# Patient Record
Sex: Female | Born: 1960 | Race: White | Hispanic: No | Marital: Married | Smoking: Never smoker
Health system: Southern US, Community
[De-identification: ages and names within clinical notes are randomized; demographics above are authoritative.]

## PROBLEM LIST (undated history)

## (undated) DIAGNOSIS — T7840XA Allergy, unspecified, initial encounter: Secondary | ICD-10-CM

## (undated) DIAGNOSIS — H269 Unspecified cataract: Secondary | ICD-10-CM

## (undated) DIAGNOSIS — N946 Dysmenorrhea, unspecified: Secondary | ICD-10-CM

## (undated) DIAGNOSIS — I10 Essential (primary) hypertension: Secondary | ICD-10-CM

## (undated) DIAGNOSIS — F419 Anxiety disorder, unspecified: Secondary | ICD-10-CM

## (undated) DIAGNOSIS — E559 Vitamin D deficiency, unspecified: Secondary | ICD-10-CM

## (undated) DIAGNOSIS — G4730 Sleep apnea, unspecified: Secondary | ICD-10-CM

## (undated) DIAGNOSIS — E789 Disorder of lipoprotein metabolism, unspecified: Secondary | ICD-10-CM

## (undated) DIAGNOSIS — E538 Deficiency of other specified B group vitamins: Secondary | ICD-10-CM

## (undated) DIAGNOSIS — F32A Depression, unspecified: Secondary | ICD-10-CM

## (undated) DIAGNOSIS — G43909 Migraine, unspecified, not intractable, without status migrainosus: Secondary | ICD-10-CM

## (undated) DIAGNOSIS — F329 Major depressive disorder, single episode, unspecified: Secondary | ICD-10-CM

## (undated) DIAGNOSIS — K5900 Constipation, unspecified: Secondary | ICD-10-CM

## (undated) DIAGNOSIS — C50919 Malignant neoplasm of unspecified site of unspecified female breast: Secondary | ICD-10-CM

## (undated) DIAGNOSIS — R739 Hyperglycemia, unspecified: Secondary | ICD-10-CM

## (undated) DIAGNOSIS — M549 Dorsalgia, unspecified: Secondary | ICD-10-CM

## (undated) DIAGNOSIS — N912 Amenorrhea, unspecified: Secondary | ICD-10-CM

## (undated) DIAGNOSIS — E781 Pure hyperglyceridemia: Secondary | ICD-10-CM

## (undated) DIAGNOSIS — M4200 Juvenile osteochondrosis of spine, site unspecified: Secondary | ICD-10-CM

## (undated) DIAGNOSIS — R7303 Prediabetes: Secondary | ICD-10-CM

## (undated) DIAGNOSIS — K760 Fatty (change of) liver, not elsewhere classified: Secondary | ICD-10-CM

## (undated) HISTORY — DX: Juvenile osteochondrosis of spine, site unspecified: M42.00

## (undated) HISTORY — PX: BREAST SURGERY: SHX581

## (undated) HISTORY — PX: GUM SURGERY: SHX658

## (undated) HISTORY — DX: Dysmenorrhea, unspecified: N94.6

## (undated) HISTORY — DX: Constipation, unspecified: K59.00

## (undated) HISTORY — DX: Amenorrhea, unspecified: N91.2

## (undated) HISTORY — DX: Allergy, unspecified, initial encounter: T78.40XA

## (undated) HISTORY — DX: Depression, unspecified: F32.A

## (undated) HISTORY — DX: Vitamin D deficiency, unspecified: E55.9

## (undated) HISTORY — DX: Disorder of lipoprotein metabolism, unspecified: E78.9

## (undated) HISTORY — DX: Unspecified cataract: H26.9

## (undated) HISTORY — DX: Hyperglycemia, unspecified: R73.9

## (undated) HISTORY — DX: Deficiency of other specified B group vitamins: E53.8

## (undated) HISTORY — DX: Prediabetes: R73.03

## (undated) HISTORY — DX: Malignant neoplasm of unspecified site of unspecified female breast: C50.919

## (undated) HISTORY — DX: Major depressive disorder, single episode, unspecified: F32.9

## (undated) HISTORY — DX: Migraine, unspecified, not intractable, without status migrainosus: G43.909

## (undated) HISTORY — DX: Anxiety disorder, unspecified: F41.9

## (undated) HISTORY — PX: BUNIONECTOMY: SHX129

## (undated) HISTORY — DX: Dorsalgia, unspecified: M54.9

## (undated) HISTORY — PX: WISDOM TOOTH EXTRACTION: SHX21

## (undated) HISTORY — DX: Pure hyperglyceridemia: E78.1

## (undated) HISTORY — DX: Essential (primary) hypertension: I10

## (undated) HISTORY — DX: Sleep apnea, unspecified: G47.30

## (undated) HISTORY — DX: Fatty (change of) liver, not elsewhere classified: K76.0

---

## 2014-05-10 DIAGNOSIS — H5213 Myopia, bilateral: Secondary | ICD-10-CM | POA: Insufficient documentation

## 2014-05-10 DIAGNOSIS — M3501 Sicca syndrome with keratoconjunctivitis: Secondary | ICD-10-CM | POA: Insufficient documentation

## 2015-09-10 DIAGNOSIS — C50919 Malignant neoplasm of unspecified site of unspecified female breast: Secondary | ICD-10-CM

## 2015-09-10 HISTORY — PX: BREAST LUMPECTOMY: SHX2

## 2015-09-10 HISTORY — DX: Malignant neoplasm of unspecified site of unspecified female breast: C50.919

## 2016-01-30 DIAGNOSIS — C50511 Malignant neoplasm of lower-outer quadrant of right female breast: Secondary | ICD-10-CM | POA: Insufficient documentation

## 2017-02-18 DIAGNOSIS — F3342 Major depressive disorder, recurrent, in full remission: Secondary | ICD-10-CM | POA: Insufficient documentation

## 2018-01-21 ENCOUNTER — Other Ambulatory Visit: Payer: Self-pay

## 2018-01-21 ENCOUNTER — Ambulatory Visit: Payer: Managed Care, Other (non HMO) | Attending: Physical Medicine & Rehabilitation | Admitting: Occupational Therapy

## 2018-01-21 DIAGNOSIS — L905 Scar conditions and fibrosis of skin: Secondary | ICD-10-CM | POA: Diagnosis present

## 2018-01-21 DIAGNOSIS — I890 Lymphedema, not elsewhere classified: Secondary | ICD-10-CM

## 2018-01-21 DIAGNOSIS — R208 Other disturbances of skin sensation: Secondary | ICD-10-CM | POA: Insufficient documentation

## 2018-01-21 NOTE — Therapy (Signed)
[place] [facility] [address] [place], [address] Phone: [phone]   Fax:  [phone]  Occupational Therapy Evaluation  Patient Details  Name: Alexandra Fitzpatrick MRN: [id number] Date of Birth: 1960/10/24 Referring Provider: Jola Baptist   Encounter Date: 01/21/2018  OT End of Session - 01/21/18 1329    Visit Number  1    Number of Visits  6    Date for OT Re-Evaluation  03/04/18    OT Start Time  1030    OT Stop Time  1138    OT Time Calculation (min)  68 min    Activity Tolerance  Patient tolerated treatment well    Behavior During Therapy  [facility] for tasks assessed/performed       No past medical history on file.  There were no vitals filed for this visit.  Subjective Assessment - 01/21/18 1228    Subjective   I had breast CA in May 2017 , had lumpectomy and radiation ended Oct 17 - no chemo -and then lymphedema started about Oct/Nov 17 - we moved 2 x since Dec 17 and has heaviness, tightness , stiffness , fullness in my R breast , under arm , upper back and arm -     Pertinent History  May 17 R breast CA, 2 ln removed and one was positive , no chemo but radiation - and that ended Oct 17  - R breast gets hard and tender and some pain at times ,     Patient Stated Goals  I do want to keep my lymphedema under control - the hardness, pain so I can use my arm without issues to swim, play with dogs, do things around the house and in yard     Currently in Pain?  No/denies        [facility] OT Assessment - 01/21/18 0001      Assessment   Medical Diagnosis  R upper Quadrant lymphedema     Referring Provider  Jola Baptist    Onset Date/Surgical Date  01/08/16    Hand Dominance  Right    Next MD Visit  -- she goes quaterly to [facility] for checkups       Precautions   Precaution Comments  -- lymphedema       Home  Environment   Lives With  Spouse      Prior Function   Vocation  Full time employment     Leisure  Work from home on computer, likes to swim , walk , yard , dog        LYMPHEDEMA/ONCOLOGY QUESTIONNAIRE - 01/21/18 1052      Right Upper Extremity Lymphedema   15 cm Proximal to Olecranon Process  39.8 cm    10 cm Proximal to Olecranon Process  35.5 cm    Olecranon Process  30.3 cm    15 cm Proximal to Ulnar Styloid Process  28 cm    10 cm Proximal to Ulnar Styloid Process  23.5 cm    Just Proximal to Ulnar Styloid Process  18 cm    Across Hand at [employer]  21.3 cm    At [place] of 2nd Digit  6.6 cm    At [facility] of Thumb  6.5 cm      Left Upper Extremity Lymphedema   15 cm Proximal to Olecranon Process  39.5 cm    10 cm Proximal to Olecranon Process  38.5 cm  Olecranon Process  30 cm    15 cm Proximal to Ulnar Styloid Process  27.5 cm    10 cm Proximal to Ulnar Styloid Process  23 cm    Just Proximal to Ulnar Styloid Process  18.5 cm    Across Hand at [employer]  21.4 cm    At [place] of 2nd Digit  7 cm    At [facility] of Thumb  6.8 cm       Discuss findings of eval   Pt to look at sleeping position and work computer setup - for avoid  propping elbow, sleeping with elbow and wrist flexed  And ergonomic setup for computer  And then over the counter compression sleeve and glove for hight risk act  And jovipak unilateral mastectomy breast pad wear as needed   pt to get prescription and refer to [facility]   And then taught self MLD  And done in clinic Do manual lymph drainage once each day to help decrease swelling.  This should take you about 30 minutes depending on the size of your limb.  For Right Arm:  Felicie Morn yourself at the base of your neck and do 8 small circles, and 2 fingers behind clavicle 8 x  . Do 8 semicircles at left armpit and right groin . Pump across chest from right to left 8 times . Pump down the right side of trunk from armpit to groin 8 times . Pump up the outside of right upper arm 8 times, inside of upper arm to outside 8x, outside of upper arm  again 8x  . Pump across chest from R to L 8 times . Pump  down the right side of trunk from armpit to groin 8 times . Do 8 semicircles at left armpit and right groin 8 times . Repeat nr.1  SLOW and LIGHT with only your palm NOT FINGERTIPS If help at home can replace or do also massage over upper back from right to left , when doing chest from right to left.                OT Education - 01/21/18 1329    Education provided  Yes    Education Details  findings , HEP , MLD -     Person(s) Educated  Patient    Methods  Explanation;Demonstration;Tactile cues;Verbal cues;Handout    Comprehension  Returned demonstration;Verbalized understanding          OT Long Term Goals - 01/21/18 1444      OT LONG TERM GOAL #1   Title  Pt to be independent in HEP to decrease fibrosis in R breast , perfrom self MLD/ and compression garments  to decrease and maintain circumference in R UE and upper quadrant     Baseline  no knowledge of HEP or garments     Time  4    Period  Weeks    Status  New    Target Date  02/18/18      OT LONG TERM GOAL #2   Title  Pt report decrease sensory symptoms in R hand at daytime and night time     Baseline  R 3rd and 4th numbness at night time and sometimes during day -positive Phalens     Time  6    Period  Weeks    Status  New    Target Date  03/04/18            Plan - 01/21/18 1436  Clinical Impression Statement  Pt present with stage 1 lymphedema in R upper quadrant - pt report that she has at times increase heaviness, tightness , fullness under R arm , in breast , upper back and upper arm - hand also is stiff and tight in the am with some  numbness in  3rd and 4th digits - pt  did had positive Froments - and at times tender over CT and cubital tunne per pt - discuss wiht pt her sleeping position, desk and computer setup  - pt do report that R breast at times harder than other times and shooting pains up to 4/10 - pt did had some fibrosis on  superior R breast - with circumference measurements pt  R  UE was no increase this date compare to L - did recommend for pt  a daytime over the  counter compression sleeve and glove for high risk activities , and Unilateral  post mastecomy breast pad to wear under bra as needed - and  taught her  self MLD - pt can benefit  from OT servicises to decrease symptoms and keer her lymphedema in stage 1     Occupational performance deficits (Please refer to evaluation for details):  ADL's;IADL's;Rest and Sleep;Work;Play;Leisure;Social Participation    Rehab Potential  Good    OT Frequency  1x / week    OT Duration  6 weeks    OT Treatment/Interventions  Therapeutic exercise;Self-care/ADL training;Manual lymph drainage;Patient/family education    Plan  Assess sleeve and glove fitting - and remeasure circumference and do MLD     Clinical Decision Making  Limited treatment options, no task modification necessary    OT Home Exercise Plan  see pt instruction    Consulted and Agree with Plan of Care  Patient       Patient will benefit from skilled therapeutic intervention in order to improve the following deficits and impairments:  Increased edema, Pain, Impaired sensation, Decreased scar mobility, Decreased strength, Impaired UE functional use  Visit Diagnosis: Lymphedema, not elsewhere classified - Plan: Ot plan of care cert/re-cert  Scar condition and fibrosis of skin - Plan: Ot plan of care cert/re-cert  Other disturbances of skin sensation - Plan: Ot plan of care cert/re-cert    Problem List There are no active problems to display for this patient.   Rosalyn Gess OTR/L,CLT  01/21/2018, 2:56 PM  [place] [facility] [address] [place], [address] Phone: [phone]   Fax:  [phone]  Name: Alexandra Fitzpatrick MRN: [id number] Date of Birth: [birth year]

## 2018-01-21 NOTE — Patient Instructions (Signed)
Pt to look at sleeping position and work computer setup - for propping elbow, sleeping with elbow and wrist flexed  And ergonomic setup for computer  And then over the counter compression sleeve and glove for hight risk act  And jovipak unilateral mastectomy breast pad wear as needed   pt to get prescription and refer to [facility]   And then taught self MLD  Do manual lymph drainage once each day to help decrease swelling.  This should take you about 30 minutes depending on the size of your limb.  For Right Arm:  Felicie Morn yourself at the base of your neck and do 8 small circles, and 2 fingers behind clavicle 8 x  . Do 8 semicircles at left armpit and right groin . Pump across chest from right to left 8 times . Pump down the right side of trunk from armpit to groin 8 times . Pump up the outside of right upper arm 8 times, inside of upper arm to outside 8x, outside of upper arm again 8x  . Pump across chest from R to L 8 times . Pump  down the right side of trunk from armpit to groin 8 times . Do 8 semicircles at left armpit and right groin 8 times . Repeat nr.1  SLOW and LIGHT with only your palm NOT FINGERTIPS If help at home can replace or do also massage over upper back from right to left , when doing chest from right to left.

## 2018-01-28 ENCOUNTER — Ambulatory Visit: Payer: Managed Care, Other (non HMO) | Admitting: Occupational Therapy

## 2018-01-28 DIAGNOSIS — L905 Scar conditions and fibrosis of skin: Secondary | ICD-10-CM

## 2018-01-28 DIAGNOSIS — I890 Lymphedema, not elsewhere classified: Secondary | ICD-10-CM

## 2018-01-28 DIAGNOSIS — R208 Other disturbances of skin sensation: Secondary | ICD-10-CM

## 2018-01-28 NOTE — Therapy (Signed)
[place] [facility] [address] [place], [address] Phone: [phone]   Fax:  [phone]  Occupational Therapy Treatment  Patient Details  Name: Alexandra Fitzpatrick MRN: [id number] Date of Birth: 1960-10-23 Referring Provider: Jola Baptist   Encounter Date: 01/28/2018  OT End of Session - 01/28/18 1114    Visit Number  2    Number of Visits  6    Date for OT Re-Evaluation  03/04/18    OT Start Time  1020    OT Stop Time  1102    OT Time Calculation (min)  42 min    Activity Tolerance  Patient tolerated treatment well    Behavior During Therapy  [facility] for tasks assessed/performed       No past medical history on file.   There were no vitals filed for this visit.  Subjective Assessment - 01/28/18 1111    Subjective   I got the unilateral mastectomy pad - how  often do you want me to wear it, and then they order my sleeve- will take about 7 days- I did the massage and also change my computer setup and height of chair     Patient Stated Goals  I do want to keep my lymphedema under control - the hardness, pain so I can use my arm without issues to swim, play with dogs, do things around the house and in yard     Currently in Pain?  No/denies          LYMPHEDEMA/ONCOLOGY QUESTIONNAIRE - 01/28/18 1036      Right Upper Extremity Lymphedema   15 cm Proximal to Olecranon Process  39.3 cm    10 cm Proximal to Olecranon Process  36 cm    Olecranon Process  30.5 cm    15 cm Proximal to Ulnar Styloid Process  28 cm    10 cm Proximal to Ulnar Styloid Process  23 cm    Just Proximal to Ulnar Styloid Process  18 cm    Across Hand at [employer]  21.5 cm    At [place] of 2nd Digit  6.6 cm    At [facility] of Thumb  6.6 cm     Measurements still the same -   Pt report less stiffness and tightness in hand and wrist - less numbness in fingers Id had some pain at DIP of 4th - but could be nodule on DIP she had in past  come out on another finger   Pt report she did look at sleeping position and work computer setup -change it and think that is helping - also  for avoid  propping elbow, sleeping with elbow and wrist flexed  She did go to [employer] and ordered over the  counter compression sleeve and glove for hight risk act  And jovipak unilateral mastectomy breast pad she got - reviewed with pt wearing of it   Pt ask to review sellf MLD  And done in clinic Do manual lymph drainage once each day to help decrease swelling.  This should take you about 30 minutes depending on the size of your limb.  For Right Arm:   Hug yourself at the base of your neck and do 8 small circles, and 2 fingers behind clavicle 8 x   Do 8 semicircles at left armpit and right groin  Pump across chest from right to left 8 times  Pump down the right side of trunk from  armpit to groin 8 times  Pump up the outside of right upper arm 8 times, inside of upper arm to outside 8x, outside of upper arm again 8x   Pump across chest from R to L 8 times  Pump  down the right side of trunk from armpit to groin 8 times  Do 8 semicircles at left armpit and right groin 8 times  Repeat nr.1  SLOW and LIGHT with only your palm NOT FINGERTIPS If help at home can replace or do also massage over upper back from right to left , when doing chest from right to left.   Needed some min A for hand placement , sequence and pressure                   OT Education - 01/28/18 1114    Education provided  Yes    Education Details  MLD review and wearing of compression     Person(s) Educated  Patient    Methods  Explanation;Demonstration    Comprehension  Verbalized understanding;Returned demonstration          OT Long Term Goals - 01/21/18 1444      OT LONG TERM GOAL #1   Title  Pt to be independent in HEP to decrease fibrosis in R breast , perfrom self MLD/ and compression garments  to decrease and maintain  circumference in R UE and upper quadrant     Baseline  no knowledge of HEP or garments     Time  4    Period  Weeks    Status  New    Target Date  02/18/18      OT LONG TERM GOAL #2   Title  Pt report decrease sensory symptoms in R hand at daytime and night time     Baseline  R 3rd and 4th numbness at night time and sometimes during day -positive Phalens     Time  6    Period  Weeks    Status  New    Target Date  03/04/18            Plan - 01/28/18 1115    Clinical Impression Statement  Pt report less tightness, stiffness in wrist and hands - as well as chest - needed review of self MLD -and use of jovipak breast pad - she did order her compression sleeve and glove - she is flying in less than 3 wks - she did change her work station to decrease possible compression on cubital tunnel and CT - review [place] of unilateral mastectmomy pad and hold off on fibrotic tech on breast     Occupational performance deficits (Please refer to evaluation for details):  ADL's;IADL's;Rest and Sleep;Work;Play;Leisure;Social Participation    Rehab Potential  Good    OT Frequency  1x / week    OT Duration  6 weeks    OT Treatment/Interventions  Therapeutic exercise;Self-care/ADL training;Manual lymph drainage;Patient/family education    Plan  Assess sleeve and glove fitting - and remeasure circumference and do MLD     OT Home Exercise Plan  see pt instruction    Consulted and Agree with Plan of Care  Patient       Patient will benefit from skilled therapeutic intervention in order to improve the following deficits and impairments:  Increased edema, Pain, Impaired sensation, Decreased scar mobility, Decreased strength, Impaired UE functional use  Visit Diagnosis: Lymphedema, not elsewhere classified  Scar condition and fibrosis of skin  Other  disturbances of skin sensation    Problem List There are no active problems to display for this patient.   Rosalyn Gess OTR/L,CLT 01/28/2018, 11:26  AM  [place] [facility] [address] [place], [address] Phone: [phone]   Fax:  [phone]  Name: Alexandra Fitzpatrick MRN: [id number] Date of Birth: 1960/10/25

## 2018-01-28 NOTE — Patient Instructions (Signed)
Use unilateral post mastectomy pad as much as she can for week or 2  Stop fibrotic tech on breast  Cont with same MLD  And phone [facility] next week to let them know she is flying in less than 3 wks to check on her sleeve

## 2018-02-09 ENCOUNTER — Ambulatory Visit: Payer: Managed Care, Other (non HMO) | Attending: Physical Medicine & Rehabilitation | Admitting: Occupational Therapy

## 2018-02-09 DIAGNOSIS — I890 Lymphedema, not elsewhere classified: Secondary | ICD-10-CM | POA: Diagnosis present

## 2018-02-09 DIAGNOSIS — L905 Scar conditions and fibrosis of skin: Secondary | ICD-10-CM

## 2018-02-09 DIAGNOSIS — R208 Other disturbances of skin sensation: Secondary | ICD-10-CM | POA: Diagnosis present

## 2018-02-09 NOTE — Therapy (Signed)
[place] [facility] [address], [place], [address] Phone: [phone]   Fax:  [phone]  Occupational Therapy Treatment  Patient Details  Name: Ren Grasse MRN: [id number] Date of Birth: 1961/06/19 Referring Provider: Jola Baptist   Encounter Date: 02/09/2018  OT End of Session - 02/09/18 1422    Visit Number  3    Number of Visits  6    Date for OT Re-Evaluation  03/04/18    OT Start Time  0738    OT Stop Time  0825    OT Time Calculation (min)  47 min    Activity Tolerance  Patient tolerated treatment well    Behavior During Therapy  [facility] for tasks assessed/performed       The histories are not reviewed yet. Please review them in the "History" navigator section and refresh this [place].  There were no vitals filed for this visit.  Subjective Assessment - 02/09/18 1419    Subjective   I got my sleeve and glove Friday and wore it on Sat - and washed it - but it took while to dry - and the jovipak I wore about 2 hrs - feels swollen under my arm     Patient Stated Goals  I do want to keep my lymphedema under control - the hardness, pain so I can use my arm without issues to swim, play with dogs, do things around the house and in yard     Currently in Pain?  No/denies          LYMPHEDEMA/ONCOLOGY QUESTIONNAIRE - 02/09/18 0744      Right Upper Extremity Lymphedema   15 cm Proximal to Olecranon Process  40 cm    10 cm Proximal to Olecranon Process  37.7 cm    Olecranon Process  30.3 cm    15 cm Proximal to Ulnar Styloid Process  28 cm    10 cm Proximal to Ulnar Styloid Process  24 cm    Just Proximal to Ulnar Styloid Process  18.2 cm    Across Hand at [employer]  21.5 cm    At [place] of 2nd Digit  6.5 cm    At [facility] of Thumb  6.5 cm         Assess pt's [employer] over the counter sleeve and glove - fit well - pt ed on donning and doffing , wearing and fitting of sleeve and glove  also  review wearing of jovipak breast pad - pt to wear it most ll the time the next 2 wks - pt had increase lymphedema under arm and thoracic  And upper arm was increase more than forearm compare to about week or 2 ago Pt to wear sleeve with high risk act   Also to cont with MLD  And pt still has at times numbness on 4th finger inside and sometimes out side -and side of 3rd - pt cubital tunnel not tender   but wrist some what  Pt did make some changes with her computer setup - she works from home   review again with pt setup and modifications              OT Education - 02/09/18 1421    Education provided  Yes    Education Details  Wearing of compression     Person(s) Educated  Patient    Methods  Explanation;Demonstration    Comprehension  Verbalized understanding;Returned demonstration  OT Long Term Goals - 01/21/18 1444      OT LONG TERM GOAL #1   Title  Pt to be independent in HEP to decrease fibrosis in R breast , perfrom self MLD/ and compression garments  to decrease and maintain circumference in R UE and upper quadrant     Baseline  no knowledge of HEP or garments     Time  4    Period  Weeks    Status  New    Target Date  02/18/18      OT LONG TERM GOAL #2   Title  Pt report decrease sensory symptoms in R hand at daytime and night time     Baseline  R 3rd and 4th numbness at night time and sometimes during day -positive Phalens     Time  6    Period  Weeks    Status  New    Target Date  03/04/18            Plan - 02/09/18 1427    Clinical Impression Statement  Pt measurement increased in upper arm  as well as in forearm - pt do report she used her arm a lot yesterday and did not wear her jovipak - pt lymphedema is increase under arm and in thoracic on the R -  pt to wear her jovipak breast pad  for 2 wks most all the time to  decongest thoracic - and cont with MLD -and wear sleeve and glove when flying - and with high risk act - will reassess in  4 wks     Occupational performance deficits (Please refer to evaluation for details):  ADL's;IADL's;Rest and Sleep;Work;Play;Leisure;Social Participation    Rehab Potential  Good    OT Frequency  Monthly    OT Duration  4 weeks    OT Treatment/Interventions  Therapeutic exercise;Self-care/ADL training;Manual lymph drainage;Patient/family education    Plan  Will follow up and reassess in month     Clinical Decision Making  Limited treatment options, no task modification necessary    OT Home Exercise Plan  see pt instruction    Consulted and Agree with Plan of Care  Patient       Patient will benefit from skilled therapeutic intervention in order to improve the following deficits and impairments:  Increased edema, Pain, Impaired sensation, Decreased scar mobility, Decreased strength, Impaired UE functional use  Visit Diagnosis: Lymphedema, not elsewhere classified  Scar condition and fibrosis of skin  Other disturbances of skin sensation    Problem List There are no active problems to display for this patient.   Rosalyn Gess OTR/L,CLT 02/09/2018, 2:31 PM  [place] [facility] [address] [place], [address] Phone: [phone]   Fax:  [phone]  Name: Johnnie Moten MRN: [id number] Date of Birth: 09-12-1960

## 2018-02-09 NOTE — Patient Instructions (Signed)
Pt to wear jovipak most all the time for 2  wks - And sleeve with high risk act - and glove as needed  To wear sleeve and glove when flying to [facility]

## 2018-06-29 ENCOUNTER — Encounter: Payer: Self-pay | Admitting: Family Medicine

## 2018-06-29 ENCOUNTER — Ambulatory Visit: Payer: Managed Care, Other (non HMO) | Admitting: Family Medicine

## 2018-06-29 VITALS — BP 106/60 | HR 75 | Temp 98.5°F | Ht 67.0 in | Wt 218.8 lb

## 2018-06-29 DIAGNOSIS — X32XXXA Exposure to sunlight, initial encounter: Secondary | ICD-10-CM

## 2018-06-29 DIAGNOSIS — R7309 Other abnormal glucose: Secondary | ICD-10-CM | POA: Diagnosis not present

## 2018-06-29 DIAGNOSIS — R7303 Prediabetes: Secondary | ICD-10-CM

## 2018-06-29 DIAGNOSIS — Z7689 Persons encountering health services in other specified circumstances: Secondary | ICD-10-CM

## 2018-06-29 DIAGNOSIS — L700 Acne vulgaris: Secondary | ICD-10-CM

## 2018-06-29 DIAGNOSIS — Z23 Encounter for immunization: Secondary | ICD-10-CM

## 2018-06-29 LAB — POCT GLYCOSYLATED HEMOGLOBIN (HGB A1C): HEMOGLOBIN A1C: 5.8 % — AB (ref 4.0–5.6)

## 2018-06-29 MED ORDER — CLINDAMYCIN PHOSPHATE 1 % EX SOLN: Freq: Two times a day (BID) | CUTANEOUS | 0 refills | Status: DC

## 2018-06-29 NOTE — Progress Notes (Signed)
Subjective:    Patient ID: Alexandra Fitzpatrick, female    DOB: 1961-01-02, 57 y.o.   MRN: [id number]  HPI This is a 57 yo female who presents today to establish care. Recently moved from [place]. Lives with her husband and daughter Apolonio Schneiders ([id number]), has son who lives in [place]. 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   Breast cancer- will continue to be followed by [facility] team.    Last CPE- annual Mammo- annual Pap- due per patient Colonoscopy- 2017 Tdap- unsure Flu- annual Eye- annual Dental- regular, [place] like "something is there" sometimes when she swallows. Happens daily, notices when she hasn't had much to drink. No problems with food. No problems with pills getting stuck. No pain. No acid reflux symptoms.   Would like referral to dermatologist. Had extensive sun exposure.   Had labs done 9/19. Had elevated blood sugar to 197. She was not fasting and had had 2 glasses of OJ. Had hemoglobin A1c, sample got lost. Has been working on weight loss. Increased exercise.   Depression- well controlled on venlafaxine xr 75 mg.  Adult acne- gets occasional breakouts on chin, uses clindamycin 1% solution with good results.   Past Medical History:  Diagnosis Date  . Cancer ([place])   . Depression   . Migraines    Past Surgical History:  Procedure Laterality Date  . BREAST LUMPECTOMY Right 2017  . BREAST SURGERY     Family History  Problem Relation Age of Onset  . Cancer Mother   . Early death Mother   . Arthritis Father   . Hypertension Father   . Cancer Sister   . Depression Sister   . Arthritis Maternal Grandmother   . Cancer Maternal Grandmother   . Arthritis Maternal Grandfather   . Cancer Maternal Grandfather   . COPD Maternal Grandfather   . Arthritis Paternal Grandmother   . Cancer Paternal Grandmother   . Arthritis Paternal Grandfather   . Cancer Paternal Grandfather    Social History   Tobacco Use  . Smoking  status: Never Smoker  . Smokeless tobacco: Never Used  Substance Use Topics  . Alcohol use: Not Currently  . Drug use: Never     Review of Systems Per HPI    Objective:   Physical Exam  Constitutional: She appears well-developed and well-nourished. No distress.  HENT:  Head: Normocephalic and atraumatic.  Eyes: Conjunctivae are normal.  Cardiovascular: Normal rate.  Pulmonary/Chest: Effort normal.  Skin: Skin is warm and dry. She is not diaphoretic.  Psychiatric: She has a normal mood and affect. Her behavior is normal. Judgment and thought content normal.  Vitals reviewed.   BP 106/60 (BP Location: Left Arm, Patient Position: Sitting, Cuff Size: Normal)   Pulse 75   Temp 98.5 F (36.9 C) (Oral)   Ht 5\' 7"  (1.702 m)   Wt 218 lb 12.8 oz (99.2 kg)   SpO2 98%   BMI 34.27 kg/m  Depression screen [facility] 2/9 06/29/2018  Decreased Interest 0  Down, Depressed, Hopeless 0  PHQ - 2 Score 0   Results for orders placed or performed in visit on 06/29/18  HgB A1c  Result Value Ref Range   Hemoglobin A1C 5.8 (A) 4.0 - 5.6 %   HbA1c POC (<> result, manual entry)     HbA1c, POC (prediabetic range)     HbA1c, POC (controlled diabetic range)         Assessment & Plan:  1.  Encounter to establish care - Outside records requested - Follow up for CPE in 3 months  2. Elevated glucose - prediabetic range, discussed with patient and provided written and verbal information regarding diet/exercise/monitoring - HgB A1c  3. Excess sun exposure - Ambulatory referral to Dermatology  4. Acne vulgaris - clindamycin (CLEOCIN T) 1 % external solution; Apply topically 2 (two) times daily.  Dispense: 30 mL; Refill: 0  5. Need for influenza vaccination - Flu Vaccine QUAD 36+ mos IM   Clarene Reamer, FNP-BC  [facility] at [facility], [facility]  06/29/2018 8:50 PM

## 2018-06-29 NOTE — Patient Instructions (Addendum)
Good to see you today  Please schedule your complete physical exam at your convenience  You can send me a mychart message when you need refills.   Prediabetes Eating Plan Prediabetes-also called impaired glucose tolerance or impaired fasting glucose-is a condition that causes blood sugar (blood glucose) levels to be higher than normal. Following a healthy diet can help to keep prediabetes under control. It can also help to lower the risk of type 2 diabetes and heart disease, which are increased in people who have prediabetes. Along with regular exercise, a healthy diet:  Promotes weight loss.  Helps to control blood sugar levels.  Helps to improve the way that the body uses insulin.  What do I need to know about this eating plan?  Use the glycemic index (GI) to plan your meals. The index tells you how quickly a food will raise your blood sugar. Choose low-GI foods. These foods take a longer time to raise blood sugar.  Pay close attention to the amount of carbohydrates in the food that you eat. Carbohydrates increase blood sugar levels.  Keep track of how many calories you take in. Eating the right amount of calories will help you to achieve a healthy weight. Losing about 7 percent of your starting weight can help to prevent type 2 diabetes.  You may want to follow a Mediterranean diet. This diet includes a lot of vegetables, lean meats or fish, whole grains, fruits, and healthy oils and fats. What foods can I eat? Grains Whole grains, such as whole-wheat or whole-grain breads, crackers, cereals, and pasta. Unsweetened oatmeal. Bulgur. Barley. Quinoa. Brown rice. Corn or whole-wheat flour tortillas or taco shells. Vegetables Lettuce. Spinach. Peas. Beets. Cauliflower. Cabbage. Broccoli. Carrots. Tomatoes. Squash. Eggplant. Herbs. Peppers. Onions. Cucumbers. Brussels sprouts. Fruits Berries. Bananas. Apples. Oranges. Grapes. Papaya. Mango. Pomegranate. Kiwi. Grapefruit. Cherries. Meats  and Other Protein Sources Seafood. Lean meats, such as chicken and [place] or lean cuts of pork and beef. Tofu. Eggs. Nuts. Beans. Dairy Low-fat or fat-free dairy products, such as yogurt, cottage cheese, and cheese. Beverages Water. Tea. Coffee. Sugar-free or diet soda. Seltzer water. Milk. Milk alternatives, such as soy or almond milk. Condiments Mustard. Relish. Low-fat, low-sugar ketchup. Low-fat, low-sugar barbecue sauce. Low-fat or fat-free mayonnaise. Sweets and Desserts Sugar-free or low-fat pudding. Sugar-free or low-fat ice cream and other frozen treats. Fats and Oils Avocado. Walnuts. Olive oil. The items listed above may not be a complete list of recommended foods or beverages. Contact your dietitian for more options. What foods are not recommended? Grains Refined white flour and flour products, such as bread, pasta, snack foods, and cereals. Beverages Sweetened drinks, such as sweet iced tea and soda. Sweets and Desserts Baked goods, such as cake, cupcakes, pastries, cookies, and cheesecake. The items listed above may not be a complete list of foods and beverages to avoid. Contact your dietitian for more information. This information is not intended to replace advice given to you by your health care provider. Make sure you discuss any questions you have with your health care provider. Document Released: 01/10/2015 Document Revised: 02/01/2016 Document Reviewed: 09/21/2014 Elsevier Interactive Patient Education  2017 [employer].

## 2018-10-06 ENCOUNTER — Other Ambulatory Visit: Payer: Self-pay | Admitting: Primary Care

## 2018-10-06 DIAGNOSIS — E538 Deficiency of other specified B group vitamins: Secondary | ICD-10-CM

## 2018-10-06 DIAGNOSIS — Z1159 Encounter for screening for other viral diseases: Secondary | ICD-10-CM

## 2018-10-06 DIAGNOSIS — R7303 Prediabetes: Secondary | ICD-10-CM

## 2018-10-12 ENCOUNTER — Other Ambulatory Visit (INDEPENDENT_AMBULATORY_CARE_PROVIDER_SITE_OTHER): Payer: 59

## 2018-10-12 DIAGNOSIS — R7303 Prediabetes: Secondary | ICD-10-CM

## 2018-10-12 DIAGNOSIS — Z1159 Encounter for screening for other viral diseases: Secondary | ICD-10-CM

## 2018-10-12 DIAGNOSIS — E538 Deficiency of other specified B group vitamins: Secondary | ICD-10-CM

## 2018-10-12 LAB — COMPREHENSIVE METABOLIC PANEL
ALT: 27 U/L (ref 0–35)
AST: 20 U/L (ref 0–37)
Albumin: 4 g/dL (ref 3.5–5.2)
Alkaline Phosphatase: 49 U/L (ref 39–117)
BILIRUBIN TOTAL: 0.5 mg/dL (ref 0.2–1.2)
BUN: 16 mg/dL (ref 6–23)
CO2: 27 meq/L (ref 19–32)
Calcium: 9.6 mg/dL (ref 8.4–10.5)
Chloride: 103 mEq/L (ref 96–112)
Creatinine, Ser: 0.94 mg/dL (ref 0.40–1.20)
GFR: 61.62 mL/min (ref >60.00)
Glucose, Bld: 124 mg/dL — ABNORMAL HIGH (ref 70–99)
Potassium: 4.2 mEq/L (ref 3.5–5.1)
Sodium: 139 mEq/L (ref 135–145)
Total Protein: 7 g/dL (ref 6.0–8.3)

## 2018-10-12 LAB — CBC
HCT: 41 % (ref 36.0–46.0)
Hemoglobin: 13.6 g/dL (ref 12.0–15.0)
MCHC: 33.1 g/dL (ref 30.0–36.0)
MCV: 93.1 fl (ref 78.0–100.0)
Platelets: 237 10*3/uL (ref 150.0–400.0)
RBC: 4.4 Mil/uL (ref 3.87–5.11)
RDW: 13.7 % (ref 11.5–15.5)
WBC: 8.4 10*3/uL (ref 4.0–10.5)

## 2018-10-12 LAB — LIPID PANEL
Cholesterol: 199 mg/dL (ref 0–200)
HDL: 38.9 mg/dL — ABNORMAL LOW (ref >39.00)
NONHDL: 159.76
TRIGLYCERIDES: 299 mg/dL — AB (ref 0.0–149.0)
Total CHOL/HDL Ratio: 5
VLDL: 59.8 mg/dL — ABNORMAL HIGH (ref 0.0–40.0)

## 2018-10-12 LAB — VITAMIN B12: Vitamin B-12: 476 pg/mL (ref 211–911)

## 2018-10-12 LAB — LDL CHOLESTEROL, DIRECT: Direct LDL: 130 mg/dL

## 2018-10-12 LAB — HEMOGLOBIN A1C: Hgb A1c MFr Bld: 6.5 % (ref 4.6–6.5)

## 2018-10-14 LAB — HEPATITIS C ANTIBODY
Hepatitis C Ab: NONREACTIVE
SIGNAL TO CUT-OFF: 0.01 (ref <1.00)

## 2018-10-16 ENCOUNTER — Ambulatory Visit (INDEPENDENT_AMBULATORY_CARE_PROVIDER_SITE_OTHER): Payer: 59 | Admitting: Family Medicine

## 2018-10-16 ENCOUNTER — Other Ambulatory Visit (HOSPITAL_COMMUNITY)
Admission: RE | Admit: 2018-10-16 | Discharge: 2018-10-16 | Disposition: A | Discharge status: Home / Self Care | Payer: 59 | Source: Ambulatory Visit | Attending: Family Medicine | Admitting: Family Medicine

## 2018-10-16 ENCOUNTER — Encounter: Payer: Self-pay | Admitting: Family Medicine

## 2018-10-16 VITALS — BP 124/68 | HR 78 | Temp 98.9°F | Ht 67.0 in | Wt 223.5 lb

## 2018-10-16 DIAGNOSIS — Z0000 Encounter for general adult medical examination without abnormal findings: Secondary | ICD-10-CM

## 2018-10-16 DIAGNOSIS — R7303 Prediabetes: Secondary | ICD-10-CM

## 2018-10-16 DIAGNOSIS — Z01419 Encounter for gynecological examination (general) (routine) without abnormal findings: Secondary | ICD-10-CM | POA: Diagnosis not present

## 2018-10-16 DIAGNOSIS — Z124 Encounter for screening for malignant neoplasm of cervix: Secondary | ICD-10-CM

## 2018-10-16 NOTE — Progress Notes (Signed)
Subjective:    Patient ID: Alexandra Fitzpatrick, female    DOB: [birth year], 58 y.o.   MRN: [id number]  HPI This is a 58 yo female who presents today for CPE. She will be moving to [place] for her husband's job. Moving next month.     Last CPE- several years Mammo- followed by [facility] for breast cancer every 6 months Pap- 06/10/16, negative HPV Colonoscopy- 10/13/15 Flu- annual Eye- 12/19 Dental- regular Diet- working to decrease sugar in coffee, one soda a week.   Past Medical History:  Diagnosis Date  . Breast cancer ([place])   . Depression   . Migraines    Past Surgical History:  Procedure Laterality Date  . BREAST LUMPECTOMY Right 2017  . BREAST SURGERY     Family History  Problem Relation Age of Onset  . Cancer Mother   . Early death Mother   . Arthritis Father   . Hypertension Father   . Cancer Sister   . Depression Sister   . Arthritis Maternal Grandmother   . Cancer Maternal Grandmother   . Arthritis Maternal Grandfather   . Cancer Maternal Grandfather   . COPD Maternal Grandfather   . Arthritis Paternal Grandmother   . Cancer Paternal Grandmother   . Arthritis Paternal Grandfather   . Cancer Paternal Grandfather    Social History   Tobacco Use  . Smoking status: Never Smoker  . Smokeless tobacco: Never Used  Substance Use Topics  . Alcohol use: Not Currently  . Drug use: Never     Review of Systems  Constitutional: Negative.   HENT: Negative.   Eyes: Negative.   Respiratory: Negative.   Cardiovascular: Negative.   Gastrointestinal: Positive for constipation (chronic, good relief with daily magnesium).  Endocrine: Negative.   Genitourinary: Negative.   Musculoskeletal: Negative.   Skin: Positive for rash (face, seeing derm, ? rosacea).  Allergic/Immunologic: Negative.   Neurological: Negative.   Hematological: Negative.   Psychiatric/Behavioral: Negative.        Objective:   Physical Exam Physical Exam  Constitutional: She is  oriented to person, place, and time. She appears well-developed and well-nourished. No distress.  HENT:  Head: Normocephalic and atraumatic.  Right Ear: External ear normal.  Left Ear: External ear normal.  Nose: Nose normal.  Mouth/Throat: Oropharynx is clear and moist. No oropharyngeal exudate.  Eyes: Conjunctivae are normal. Pupils are equal, round, and reactive to light.  Neck: Normal range of motion. Neck supple. No JVD present. No thyromegaly present.  Cardiovascular: Normal rate, regular rhythm, normal heart sounds and intact distal pulses.   Pulmonary/Chest: Effort normal and breath sounds normal. Right breast exhibits no inverted nipple, no mass, no nipple discharge, no skin change and no tenderness. Left breast exhibits no inverted nipple, no mass, no nipple discharge, no skin change and no tenderness. Breasts are symmetrical.  Abdominal: Soft. Bowel sounds are normal. She exhibits no distension and no mass. There is no tenderness. There is no rebound and no guarding.  Genitourinary: Vagina normal. Pelvic exam was performed with patient supine. There is no rash, tenderness, lesion or injury on the right labia. There is no rash, tenderness, lesion or injury on the left labia. Cervix exhibits no motion tenderness and no discharge. No vaginal discharge found.  Musculoskeletal: Normal range of motion. She exhibits no edema or tenderness.  Lymphadenopathy:    She has no cervical adenopathy.  Neurological: She is alert and oriented to person, place, and time. Psychiatric: She has a  normal mood and affect. Her behavior is normal. Judgment and thought content normal.  Vitals reviewed.     BP 124/68 (BP Location: Left Arm, Patient Position: Sitting, Cuff Size: Large)   Pulse 78   Temp 98.9 F (37.2 C) (Oral)   Ht 5\' 7"  (1.702 m)   Wt 223 lb 8 oz (101.4 kg)   SpO2 95%   BMI 35.01 kg/m  Wt Readings from Last 3 Encounters:  10/16/18 223 lb 8 oz (101.4 kg)  06/29/18 218 lb 12.8 oz (99.2  kg)       Assessment & Plan:  1. Annual physical exam - Discussed and encouraged healthy lifestyle choices- adequate sleep, regular exercise, stress management and healthy food choices.    2. Screening for cervical cancer - Cytology - PAP([place])  3. Prediabetes - discussed diet and exercise  - patient will be moving soon and will establish with a new provider, she has follow up scheduled with her oncologist in about 6 months and will ask them to recheck her lipids and hemoglobin A1C.    Clarene Reamer, FNP-BC  [facility] at [facility], [facility]  10/16/2018 9:12 AM

## 2018-10-16 NOTE — Patient Instructions (Signed)
Protein- 20 grams for each meal  Keeping You Healthy  Get These Tests  Blood Pressure- Have your blood pressure checked by your healthcare provider at least once a year.  Normal blood pressure is 120/80.  Weight- Have your body mass index (BMI) calculated to screen for obesity.  BMI is a measure of body fat based on height and weight.  You can calculate your own BMI at [employer]velB[employer].it  Cholesterol- Have your cholesterol checked every year.  Diabetes- Have your blood sugar checked every year if you have high blood pressure, high cholesterol, a family history of diabetes or if you are overweight.  Pap Test - Have a pap test every 1 to 5 years if you have been sexually active.  If you are older than 65 and recent pap tests have been normal you may not need additional pap tests.  In addition, if you have had a hysterectomy  for benign disease additional pap tests are not necessary.  Mammogram-Yearly mammograms are essential for early detection of breast cancer  Screening for Colon Cancer- Colonoscopy starting at age 56. Screening may begin sooner depending on your family history and other health conditions.  Follow up colonoscopy as directed by your Gastroenterologist.  Screening for Osteoporosis- Screening begins at age 56 with bone density scanning, sooner if you are at higher risk for developing Osteoporosis.  Get these medicines  Calcium with Vitamin D- Your body requires 1200-1500 mg of Calcium a day and [phone] IU of Vitamin D a day.  You can only absorb 500 mg of Calcium at a time therefore Calcium must be taken in 2 or 3 separate doses throughout the day.  Hormones- Hormone therapy has been associated with increased risk for certain cancers and heart disease.  Talk to your healthcare provider about if you need relief from menopausal symptoms.  Aspirin- Ask your healthcare provider about taking Aspirin to prevent Heart Disease and Stroke.  Get these Immuniztions  Flu  shot- Every fall  Pneumonia shot- Once after the age of 41; if you are younger ask your healthcare provider if you need a pneumonia shot.  Tetanus- Every ten years.  Zostavax- Once after the age of 33 to prevent shingles.  Take these steps  Don't smoke- Your healthcare provider can help you quit. For tips on how to quit, ask your healthcare provider or go to www.smokefree.gov or call 1-800 QUIT-NOW.  Be physically active- Exercise 5 days a week for a minimum of 30 minutes.  If you are not already physically active, start slow and gradually work up to 30 minutes of moderate physical activity.  Try walking, dancing, bike riding, swimming, etc.  Eat a healthy diet- Eat a variety of healthy foods such as fruits, vegetables, whole grains, low fat milk, low fat cheeses, yogurt, lean meats, chicken, fish, eggs, dried beans, tofu, etc.  For more information go to www.thenutritionsource.org  Dental visit- Brush and floss teeth twice daily; visit your dentist twice a year.  Eye exam- Visit your Optometrist or Ophthalmologist yearly.  Drink alcohol in moderation- Limit alcohol intake to one drink or less a day.  Never drink and drive.  Depression- Your emotional health is as important as your physical health.  If you're feeling down or losing interest in things you normally enjoy, please talk to your healthcare provider.  Seat Belts- can save your life; always wear one  Smoke/Carbon Monoxide detectors- These detectors need to be installed on the appropriate level of your home.  Replace batteries at  least once a year.  Violence- If anyone is threatening or hurting you, please tell your healthcare provider.  Living Will/ Health care power of attorney- Discuss with your healthcare provider and family.

## 2018-10-19 LAB — CYTOLOGY - PAP
ADEQUACY: ABSENT
Bacterial vaginitis: NEGATIVE
Candida vaginitis: NEGATIVE
Diagnosis: NEGATIVE
HPV: NOT DETECTED

## 2018-12-21 ENCOUNTER — Ambulatory Visit (INDEPENDENT_AMBULATORY_CARE_PROVIDER_SITE_OTHER): Payer: 59 | Admitting: Family Medicine

## 2018-12-21 ENCOUNTER — Encounter: Payer: Self-pay | Admitting: Family Medicine

## 2018-12-21 VITALS — BP 142/122 | Temp 101.0°F

## 2018-12-21 DIAGNOSIS — Z20828 Contact with and (suspected) exposure to other viral communicable diseases: Secondary | ICD-10-CM

## 2018-12-21 DIAGNOSIS — R070 Pain in throat: Secondary | ICD-10-CM

## 2018-12-21 DIAGNOSIS — R6889 Other general symptoms and signs: Principal | ICD-10-CM

## 2018-12-21 DIAGNOSIS — R05 Cough: Secondary | ICD-10-CM

## 2018-12-21 DIAGNOSIS — R509 Fever, unspecified: Secondary | ICD-10-CM

## 2018-12-21 DIAGNOSIS — Z20822 Contact with and (suspected) exposure to covid-19: Secondary | ICD-10-CM

## 2018-12-21 NOTE — Patient Instructions (Signed)
How to care for yourself at home:   1) Drink plenty of fluids 2) Get lots of rest 3) Wash your hands regularly - for 20 seconds 4) Cover your mouth when you cough -- ideally cough into your elbow 5) Take tylenol - can do up to 1000 mg every 8 hours (or do smaller doses more frequently) - Ibuprofen is also OK 6) Stay home - avoid contact with other people. Ideally have someone bring your groceries, etc.  7) Avoid touching your eyes, nose, mouth 8) Over the counter cold medication may be helpful  You leave home again - when ALL the following are TRUE:  1) No fever for at least 72 hours (without taking any medication) 2) Other symptoms have improved - no longer with cough or shortness of breath 3) At least 7 days since you got sick  Call the clinic immediately or consider going to the emergency room if:  1) Trouble Breathing 2) Persistent pain or pressure in the chest 3) New confusion or inability to wake 4) Bluish lips or face 5) Notify them that you may have COVID-19  For close contacts (people in your home)  1) Help the person you are with stay home - grocery, pharmacy, etc 2) Help monitor their symptoms for worsening illness 3) Ideally sleep in a separate room and try to use separate bathroom if possible 4) Try to limit care giver to ONE person - all others (including pets) should avoid contact 5) Clean surfaces often and wash laundry often 6) Monitor yourself for symptoms. Make sure you contact your health care provider and avoid work as soon as you develop symptoms 7) Ideally would recommend working from home or not going to work if able.    Medications:  You may have heard about medications currently being used to treat COVID-19 - including Remdesivir and Hydroxychloroquine/Chloroquine. Currently there are no FDA approved medications to treat COVID-19 and these medicines are only being used as part of a clinical trials (still studying the effect). They are only being used in  hospitalized patients because they come with significant risks.   The only proven treatment is symptomatic care - rest, fluids, tylenol.

## 2018-12-21 NOTE — Progress Notes (Signed)
    I connected with Druscilla Brownie on 12/21/18 at  2:00 PM EDT by video and verified that I am speaking with the correct person using two identifiers.   I discussed the limitations, risks, security and privacy concerns of performing an evaluation and management service by video and the availability of in person appointments. I also discussed with the patient that there may be a patient responsible charge related to this service. The patient expressed understanding and agreed to proceed.  Patient location: Home Provider Location: [place] Participants: Lesleigh Noe and Nani Ravens [place]   Subjective:     Alexandra Fitzpatrick is a 58 y.o. female presenting for URI (Unsure exactly when her symptoms started because she is on tamoxifen. Since about 12-17-18 started with worsening body aches, fever, headache, fatigue. cough intermittent. Husband did a VV with Dr Yong Channel on 12-17-18 and presumed Covid-19. )     URI   This is a new problem. The current episode started in the past 7 days. The maximum temperature recorded prior to her arrival was 101 - 101.9 F. The fever has been present for 1 to 2 days. Associated symptoms include coughing, headaches and a sore throat (chronic with allergies). Pertinent negatives include no diarrhea, nausea, rhinorrhea, vomiting or wheezing.   Those symptoms are common side effects of tamoxifen   Husband was diagnosed on 4/9 -- has been isolating and trying to use masks/gloves - he works in [place] at a [employer]  But 1 week ago 4/3-4/5 was in [place] together  Review of Systems  Constitutional: Positive for appetite change, fatigue and fever.  HENT: Positive for sinus pressure and sore throat (chronic with allergies). Negative for rhinorrhea.   Eyes: Negative for itching.  Respiratory: Positive for cough. Negative for shortness of breath and wheezing.   Gastrointestinal: Negative for diarrhea, nausea and vomiting.   Musculoskeletal: Positive for arthralgias and myalgias.  Neurological: Positive for headaches.     Social History   Tobacco Use  Smoking Status Never Smoker  Smokeless Tobacco Never Used        Objective:   BP Readings from Last 3 Encounters:  12/21/18 (!) 142/122  10/16/18 124/68  06/29/18 106/60   Wt Readings from Last 3 Encounters:  10/16/18 223 lb 8 oz (101.4 kg)  06/29/18 218 lb 12.8 oz (99.2 kg)    BP (!) 142/122   Temp (!) 101 F (38.3 C) (Oral)    Physical Exam Constitutional:      Appearance: Normal appearance.  HENT:     Head: Normocephalic and atraumatic.     Nose: Nose normal.  Eyes:     Conjunctiva/sclera: Conjunctivae normal.  Pulmonary:     Effort: Pulmonary effort is normal. No respiratory distress.  Neurological:     Mental Status: She is alert. Mental status is at baseline.  Psychiatric:        Mood and Affect: Mood normal.          Assessment & Plan:   Problem List Items Addressed This Visit    None    Visit Diagnoses    Suspected Covid-19 Virus Infection    -  Primary     Given precautions and discussed when it was safe to say illness if over.   Return if symptoms worsen or fail to improve.  Lesleigh Noe, MD

## 2018-12-29 ENCOUNTER — Telehealth: Payer: Self-pay

## 2018-12-29 NOTE — Telephone Encounter (Signed)
Spoke with patient. She is not coughing. Had a temp of 99.1 - low grade on 12/27/2018 or 12/28/2018. Has not taking temp yet today. She has allergies symptoms and does not think it is related to [place]. Overall feeling better since virtual visit with Dr. Einar Pheasant on 12/21/2018. Body aches are better.

## 2018-12-29 NOTE — Telephone Encounter (Signed)
Did a follow up call with patient who was on my respiratory illness log to follow up

## 2019-04-27 ENCOUNTER — Encounter: Payer: Self-pay | Admitting: Medical Oncology

## 2019-04-27 ENCOUNTER — Telehealth: Payer: Self-pay | Admitting: *Deleted

## 2019-04-27 ENCOUNTER — Emergency Department: Payer: 59

## 2019-04-27 ENCOUNTER — Other Ambulatory Visit: Payer: Self-pay

## 2019-04-27 ENCOUNTER — Emergency Department
Admission: EM | Admit: 2019-04-27 | Discharge: 2019-04-27 | Disposition: A | Discharge status: Home / Self Care | Payer: 59 | Attending: Emergency Medicine | Admitting: Emergency Medicine

## 2019-04-27 DIAGNOSIS — Z853 Personal history of malignant neoplasm of breast: Secondary | ICD-10-CM | POA: Diagnosis not present

## 2019-04-27 DIAGNOSIS — A09 Infectious gastroenteritis and colitis, unspecified: Secondary | ICD-10-CM | POA: Diagnosis not present

## 2019-04-27 DIAGNOSIS — Z7982 Long term (current) use of aspirin: Secondary | ICD-10-CM | POA: Diagnosis not present

## 2019-04-27 DIAGNOSIS — R1084 Generalized abdominal pain: Secondary | ICD-10-CM | POA: Diagnosis present

## 2019-04-27 LAB — URINALYSIS, COMPLETE (UACMP) WITH MICROSCOPIC
Bilirubin Urine: NEGATIVE
Glucose, UA: NEGATIVE mg/dL
Ketones, ur: NEGATIVE mg/dL
Leukocytes,Ua: NEGATIVE
Nitrite: NEGATIVE
Protein, ur: NEGATIVE mg/dL
Specific Gravity, Urine: 1.006 (ref 1.005–1.030)
Squamous Epithelial / LPF: NONE SEEN (ref 0–5)
pH: 6 (ref 5.0–8.0)

## 2019-04-27 LAB — LIPASE, BLOOD: Lipase: 25 U/L (ref 11–51)

## 2019-04-27 LAB — COMPREHENSIVE METABOLIC PANEL
ALT: 33 U/L (ref 0–44)
AST: 32 U/L (ref 15–41)
Albumin: 4.1 g/dL (ref 3.5–5.0)
Alkaline Phosphatase: 56 U/L (ref 38–126)
Anion gap: 12 (ref 5–15)
BUN: 10 mg/dL (ref 6–20)
CO2: 22 mmol/L (ref 22–32)
Calcium: 9.1 mg/dL (ref 8.9–10.3)
Chloride: 104 mmol/L (ref 98–111)
Creatinine, Ser: 0.87 mg/dL (ref 0.44–1.00)
GFR calc Af Amer: >60 mL/min (ref >60)
GFR calc non Af Amer: >60 mL/min (ref >60)
Glucose, Bld: 178 mg/dL — ABNORMAL HIGH (ref 70–99)
Potassium: 3.5 mmol/L (ref 3.5–5.1)
Sodium: 138 mmol/L (ref 135–145)
Total Bilirubin: 0.8 mg/dL (ref 0.3–1.2)
Total Protein: 7.3 g/dL (ref 6.5–8.1)

## 2019-04-27 LAB — CBC
HCT: 42.4 % (ref 36.0–46.0)
Hemoglobin: 14.1 g/dL (ref 12.0–15.0)
MCH: 30.5 pg (ref 26.0–34.0)
MCHC: 33.3 g/dL (ref 30.0–36.0)
MCV: 91.8 fL (ref 80.0–100.0)
Platelets: 239 10*3/uL (ref 150–400)
RBC: 4.62 MIL/uL (ref 3.87–5.11)
RDW: 12.8 % (ref 11.5–15.5)
WBC: 15.3 10*3/uL — ABNORMAL HIGH (ref 4.0–10.5)
nRBC: 0 % (ref 0.0–0.2)

## 2019-04-27 LAB — LACTIC ACID, PLASMA: Lactic Acid, Venous: 1.8 mmol/L (ref 0.5–1.9)

## 2019-04-27 MED ORDER — CIPROFLOXACIN HCL 500 MG PO TABS: 500.0000 mg | ORAL_TABLET | Freq: Two times a day (BID) | ORAL | 0 refills | Status: AC

## 2019-04-27 MED ORDER — METRONIDAZOLE 500 MG PO TABS: 500.0000 mg | ORAL_TABLET | Freq: Two times a day (BID) | ORAL | 0 refills | Status: AC

## 2019-04-27 MED ORDER — IOHEXOL 300 MG/ML  SOLN
100.0000 mL | Freq: Once | INTRAMUSCULAR | Status: AC | PRN
  Administered 2019-04-27: 100 mL via INTRAVENOUS

## 2019-04-27 NOTE — Discharge Instructions (Addendum)
Take the antibiotics as prescribed and finish the full course.  Return to the ER for new, worsening, or persistent severe diarrhea, abdominal pain, fever, nausea or vomiting, inability to tolerate anything by mouth or take the antibiotics, persistent or worsening blood in the stool, or any other new or worsening symptoms that concern you.  You should also start an over-the-counter probiotic such as a lactobacillus pill in order to help restore the good bacteria in your intestine.

## 2019-04-27 NOTE — ED Notes (Signed)
Pt transported to CT ?

## 2019-04-27 NOTE — ED Triage Notes (Signed)
Pt reports yesterday she began having nausea and then diarrhea multiple times with bright red blood in it. Pt denies pain only cramping in lower abd with diarrhea.

## 2019-04-27 NOTE — ED Provider Notes (Signed)
-----------------------------------------   4:56 PM on 04/27/2019 -----------------------------------------  I took over care on this patient from Dr. Jari Pigg.  The patient had diarrhea with blood in it and some abdominal cramping.  CT abdomen revealed findings compatible with colitis.  Clinically the patient has infectious colitis as she has no active abdominal pain, and elevated WBC count, and diarrhea.  However Dr. Jari Pigg had ordered a lactic acid to help rule out ischemic etiology.  The lactic acid is normal.  On reassessment the patient continues to appear comfortable and has no active abdominal pain.  There is no clinical evidence to suggest ischemic colitis.  She is tolerating p.o. and would like to go home.  Her vital signs are normal except for hypertension, and at this time the patient is stable for discharge with outpatient treatment.  I prescribed Cipro and Flagyl.  I gave the patient thorough return precautions and she expressed understanding.   Arta Silence, MD 04/27/19 [phone]

## 2019-04-27 NOTE — ED Provider Notes (Signed)
[facility] Emergency Department Provider Note  ____________________________________________   First MD Initiated Contact with Patient 04/27/19 1351     (approximate)  I have reviewed the triage vital signs and the nursing notes.   HISTORY  Chief Complaint Nausea, Diarrhea, and Blood In Stools    HPI Alexandra Fitzpatrick is a 58 y.o. female with breast cancer, depression who presents with multiple symptoms.  Patient is presenting with nausea and diarrhea with bright red blood.  Also having some lower abdominal pain, that was worse yesterday, better today, cramping sensation, nothing makes it better or worse.  Did have some vomiting yesterday as well. No fevers. NO one else sick at home. No recent antibiotics.           Past Medical History:  Diagnosis Date  . Breast cancer ([place])   . Depression   . Migraines     Patient Active Problem List   Diagnosis Date Noted  . Prediabetes 06/29/2018    Past Surgical History:  Procedure Laterality Date  . BREAST LUMPECTOMY Right 2017  . BREAST SURGERY      Prior to Admission medications   Medication Sig Start Date End Date Taking? Authorizing Provider  aspirin EC 81 MG tablet Take 81 mg by mouth daily.    [provider]  cholecalciferol (VITAMIN D) 400 units TABS tablet Take 400 Units by mouth.    [provider]  clindamycin (CLEOCIN T) 1 % external solution Apply topically 2 (two) times daily. Patient not taking: Reported on 12/21/2018 06/29/18   Elby Beck, FNP  clobetasol cream (TEMOVATE) 0.32 % Apply 1 application topically as needed.    [provider]  loratadine (CLARITIN) 10 MG tablet Take 10 mg by mouth daily.    [provider]  Magnesium Citrate 100 MG TABS Take by mouth.    [provider]  Multiple Vitamins-Minerals (CENTRUM ADULTS PO) Take by mouth.    [provider]  SUMAtriptan (IMITREX) 50 MG tablet TAKE 1 TABLET BY  MOUTH AS NEEDED FOR MIGRAINE 05/19/18   [provider]  tamoxifen (NOLVADEX) 20 MG tablet Take 20 mg by mouth daily.    [provider]  venlafaxine XR (EFFEXOR-XR) 75 MG 24 hr capsule  07/12/16   [provider]  vitamin B-12 (CYANOCOBALAMIN) 1000 MCG tablet Take 1,000 mcg by mouth daily.    [provider]    Allergies Codeine  Family History  Problem Relation Age of Onset  . Cancer Mother   . Early death Mother   . Arthritis Father   . Hypertension Father   . Cancer Sister   . Depression Sister   . Arthritis Maternal Grandmother   . Cancer Maternal Grandmother   . Arthritis Maternal Grandfather   . Cancer Maternal Grandfather   . COPD Maternal Grandfather   . Arthritis Paternal Grandmother   . Cancer Paternal Grandmother   . Arthritis Paternal Grandfather   . Cancer Paternal Grandfather     Social History Social History   Tobacco Use  . Smoking status: Never Smoker  . Smokeless tobacco: Never Used  Substance Use Topics  . Alcohol use: Not Currently  . Drug use: Never      Review of Systems Constitutional: No fever/chills Eyes: No visual changes. ENT: No sore throat. Cardiovascular: Denies chest pain. Respiratory: Denies shortness of breath. Gastrointestinal: + abd pain, vomiting, diarrhea  Genitourinary: Negative for dysuria. Musculoskeletal: Negative for back pain. Skin: Negative for rash. Neurological:  Negative for headaches, focal weakness or numbness. All other ROS negative ____________________________________________   PHYSICAL EXAM:  VITAL SIGNS: ED Triage Vitals  Enc Vitals Group     BP 04/27/19 1217 (!) 151/79     Pulse Rate 04/27/19 1217 89     Resp 04/27/19 1217 19     Temp 04/27/19 1217 98.8 F (37.1 C)     Temp src --      SpO2 04/27/19 1217 98 %     Weight 04/27/19 1218 217 lb (98.4 kg)     Height 04/27/19 1218 5\' 8"  (1.727 m)     Head Circumference --      Peak Flow --      Pain Score 04/27/19  1228 3     Pain Loc --      Pain Edu? --      Excl. in [place]? --     Constitutional: Alert and oriented. Well appearing and in no acute distress. Eyes: Conjunctivae are normal. EOMI. Head: Atraumatic. Nose: No congestion/rhinnorhea. Mouth/Throat: Mucous membranes are moist.   Neck: No stridor. Trachea Midline. FROM Cardiovascular: Normal rate, regular rhythm. Grossly normal heart sounds.  Good peripheral circulation. Respiratory: Normal respiratory effort.  No retractions. Lungs CTAB. Gastrointestinal: Mild lower abdominal tenderness. No distention. No abdominal bruits.  Musculoskeletal: No lower extremity tenderness nor edema.  No joint effusions. Neurologic:  Normal speech and language. No gross focal neurologic deficits are appreciated.  Skin:  Skin is warm, dry and intact. No rash noted. Psychiatric: Mood and affect are normal. Speech and behavior are normal. GU: Deferred   ____________________________________________   LABS (all labs ordered are listed, but only abnormal results are displayed)  Labs Reviewed  COMPREHENSIVE METABOLIC PANEL - Abnormal; Notable for the following components:      Result Value   Glucose, Bld 178 (*)    All other components within normal limits  CBC - Abnormal; Notable for the following components:   WBC 15.3 (*)    All other components within normal limits  URINALYSIS, COMPLETE (UACMP) WITH MICROSCOPIC - Abnormal; Notable for the following components:   Color, Urine YELLOW (*)    APPearance CLEAR (*)    Hgb urine dipstick SMALL (*)    Bacteria, UA RARE (*)    All other components within normal limits  LIPASE, BLOOD  LACTIC ACID, PLASMA  LACTIC ACID, PLASMA   ____________________________________________    RADIOLOGY  Official radiology report(s): Ct Abdomen Pelvis W Contrast  Result Date: 04/27/2019 CLINICAL DATA:  Nausea and diarrhea with bright red blood. Cramping in lower abdomen. EXAM: CT ABDOMEN AND PELVIS WITH CONTRAST  TECHNIQUE: Multidetector CT imaging of the abdomen and pelvis was performed using the standard protocol following bolus administration of intravenous contrast. CONTRAST:  140mL OMNIPAQUE IOHEXOL 300 MG/ML  SOLN COMPARISON:  None. FINDINGS: Lower chest: No acute abnormality. Hepatobiliary: Liver is diffusely low in density indicating fatty infiltration. Gallbladder appears normal. No bile duct dilatation seen. Pancreas: Unremarkable. No pancreatic ductal dilatation or surrounding inflammatory changes. Spleen: Normal in size without focal abnormality. Adrenals/Urinary Tract: Adrenal glands appear normal. Kidneys are unremarkable without suspicious mass, stone or hydronephrosis. No ureteral or bladder calculi identified. Bladder is unremarkable, decompressed. Stomach/Bowel: Thickening of the walls of the LEFT colon and distal transverse colon, with associated pericolonic inflammation/fluid stranding. Remainder of the bowel is unremarkable. Stomach appears normal, partially decompressed. Appendix is normal. Vascular/Lymphatic: No significant vascular findings are present. No enlarged abdominal or pelvic lymph nodes. Reproductive: Probable small uterine fibroids.  No adnexal mass or free fluid. Other: No abscess collection seen. No free intraperitoneal air. Musculoskeletal: No acute or suspicious osseous finding. IMPRESSION: 1. Thickening of the walls of the LEFT colon and distal transverse colon, with associated pericolonic inflammation/fluid stranding. Findings are consistent with acute colitis of infectious, inflammatory or ischemic nature. No associated bowel obstruction. No abscess collection seen. No free intraperitoneal air. 2. Fatty infiltration of the liver. 3. Probable small uterine fibroids. 4. Remainder of the abdomen and pelvis CT is unremarkable, as detailed above. Electronically Signed   By: Franki Cabot M.D.   On: 04/27/2019 15:37    ____________________________________________   PROCEDURES   Procedure(s) performed (including Critical Care):  Procedures   ____________________________________________   INITIAL IMPRESSION / ASSESSMENT AND PLAN / ED COURSE  Viney Acocella was evaluated in Emergency Department on 04/27/2019 for the symptoms described in the history of present illness. She was evaluated in the context of the global COVID-19 pandemic, which necessitated consideration that the patient might be at risk for infection with the SARS-CoV-2 virus that causes COVID-19. Institutional protocols and algorithms that pertain to the evaluation of patients at risk for COVID-19 are in a state of rapid change based on information released by regulatory bodies including the CDC and federal and state organizations. These policies and algorithms were followed during the patient's care in the ED.    Pt is well appearing with abdominal pain and BRBPR.  Will get CT scan to evaluate for perforation, abscess, diverticulitis. Will get hgb to evaluate for anemia.    Clinical Course as of Apr 27 1823  Tue Apr 27, 2019  1410 Hemoglobin: 14.1 [MF]    Clinical Course User Index [MF] Vanessa Carver, MD    Lipase is normal making pancreatitis less likely.  Sugar slightly elevated 178 but no evidence of DKA.  White count is elevated at 15.3. Labs re-assuring with normal hgb.  CT scan with colitis. Added on lactate but high suspicion this is infectious colitis.  Pt handed off to incoming team pending Lactate but anticipate d/c home with cipro/flagyl. Low suspicion for c.diff given no risk.    ____________________________________________   FINAL CLINICAL IMPRESSION(S) / ED DIAGNOSES   Final diagnoses:  Infectious colitis      MEDICATIONS GIVEN DURING THIS VISIT:  Medications  iohexol (OMNIPAQUE) 300 MG/ML solution 100 mL (100 mLs Intravenous Contrast Given 04/27/19 1524)     ED Discharge Orders         Ordered    ciprofloxacin (CIPRO) 500 MG tablet  2 times daily      04/27/19 1655    metroNIDAZOLE (FLAGYL) 500 MG tablet  2 times daily     04/27/19 1655           Note:  This document was prepared using Dragon voice recognition software and may include unintentional dictation errors.   Vanessa [place], MD 04/27/19 [phone]

## 2019-04-27 NOTE — Telephone Encounter (Signed)
Patient called stating that she is on Tamoxifen. Patient stated that she started with abdominal pain, vomiting, chills, sweating and bloody diarrhea yesterday. Patient stated that today she is having some abdominal pain, still having bloody diarrhea and a fever of 100.0. Patient stated that she is able to keep some liquids down this morning. Patient stated that her back still hurts today and concerned about the bloody diarrhea. After speaking with Webb Silversmith NP patient was advised that she needs to go to the ER to be evaluated and she verbalized understanding. Patient stated that she will get ready and head over the [facility] ER.

## 2019-04-27 NOTE — Telephone Encounter (Signed)
Agree with ER eval.

## 2019-04-28 NOTE — Telephone Encounter (Signed)
Noted. Patient presented to ER as advised.

## 2019-05-05 ENCOUNTER — Encounter: Payer: Self-pay | Admitting: Family Medicine

## 2019-05-05 ENCOUNTER — Ambulatory Visit (INDEPENDENT_AMBULATORY_CARE_PROVIDER_SITE_OTHER): Payer: 59 | Admitting: Family Medicine

## 2019-05-05 ENCOUNTER — Other Ambulatory Visit: Payer: Self-pay

## 2019-05-05 VITALS — Ht 68.0 in | Wt 219.0 lb

## 2019-05-05 DIAGNOSIS — K529 Noninfective gastroenteritis and colitis, unspecified: Secondary | ICD-10-CM | POA: Diagnosis not present

## 2019-05-05 NOTE — Progress Notes (Signed)
Virtual Visit via Video Note  I connected with Alexandra Fitzpatrick on 05/05/19 at  9:00 AM EDT by a video enabled telemedicine application and verified that I am speaking with the correct person using two identifiers.  Location: Patient: In her home Provider: [place]   I discussed the limitations of evaluation and management by telemedicine and the availability of in person appointments. The patient expressed understanding and agreed to proceed.  History of Present Illness: Chief Complaint  Patient presents with  . Follow-up    Pt seen at [facility] for collitis. Pt states that she has been on 2 different abx since home and is slowly seeing improvement.    This is a 58 yo female who requests video visit to follow up from recent ER visit. She was seen 04/27/2019 for abdominal cramping and bloody diarrhea, diagnosed with infectious colitis and given cipro/flagyl. She had elevated WBC and glucose, but otherwise negative labs. She reports that she is doing well on antibiotics. Abdominal pain and cramping at a minimum. No additional blood in stool. Prior to ER visit, she had daily, multiple small formed bms with an every 7-9 day larger BM. This has been her typical pattern since starting on tamoxifen several years ago. She takes magnesium. She has good vegetable intake. She was encouraged to take daily MiraLax by her oncologist but did not feel that she needed it. She has a little bloating after a week without good BM, but otherwise no symptoms of constipation. She had negative colonoscopy 2017 (record visible in care everywhere).    Observations/Objective: Patient is alert and answers questions appropriately.  Visible skin is unremarkable.  She is normally conversive without shortness of breath, audible wheeze or witnessed cough.  Her mood and affect are appropriate. Ht 5\' 8"  (1.727 m)   Wt 219 lb (99.3 kg)   BMI 33.30 kg/m  Wt Readings from Last 3 Encounters:  05/05/19 219 lb (99.3 kg)   04/27/19 217 lb (98.4 kg)  10/16/18 223 lb 8 oz (101.4 kg)    Assessment and Plan: 1. Colitis -Finish antibiotics and continue probiotics if desired -Encouraged her to use 1 dose of MiraLAX daily to improve bowel movements -If any additional episodes, will refer to GI -Discussed fiber intake take and encouraged 5-7 servings of fruits and vegetables daily   Clarene Reamer, FNP-BC  [facility] [place]  05/05/2019 9:17 AM   Follow Up Instructions: Visit recap sent to patient via my chart   I discussed the assessment and treatment plan with the patient. The patient was provided an opportunity to ask questions and all were answered. The patient agreed with the plan and demonstrated an understanding of the instructions.   The patient was advised to call back or seek an in-person evaluation if the symptoms worsen or if the condition fails to improve as anticipated.   Elby Beck, FNP

## 2020-02-22 ENCOUNTER — Ambulatory Visit: Payer: 59 | Admitting: Physician Assistant

## 2020-02-22 ENCOUNTER — Encounter: Payer: Self-pay | Admitting: Physician Assistant

## 2020-02-22 ENCOUNTER — Other Ambulatory Visit: Payer: Self-pay

## 2020-02-22 VITALS — BP 140/88 | HR 69 | Temp 98.6°F | Ht 68.0 in | Wt 216.0 lb

## 2020-02-22 DIAGNOSIS — Z124 Encounter for screening for malignant neoplasm of cervix: Secondary | ICD-10-CM | POA: Diagnosis not present

## 2020-02-22 DIAGNOSIS — Z79810 Long term (current) use of selective estrogen receptor modulators (SERMs): Secondary | ICD-10-CM | POA: Insufficient documentation

## 2020-02-22 DIAGNOSIS — L989 Disorder of the skin and subcutaneous tissue, unspecified: Secondary | ICD-10-CM | POA: Diagnosis not present

## 2020-02-22 DIAGNOSIS — R7303 Prediabetes: Secondary | ICD-10-CM | POA: Diagnosis not present

## 2020-02-22 DIAGNOSIS — G43909 Migraine, unspecified, not intractable, without status migrainosus: Secondary | ICD-10-CM | POA: Insufficient documentation

## 2020-02-22 DIAGNOSIS — E669 Obesity, unspecified: Secondary | ICD-10-CM | POA: Diagnosis not present

## 2020-02-22 DIAGNOSIS — F3342 Major depressive disorder, recurrent, in full remission: Secondary | ICD-10-CM

## 2020-02-22 NOTE — Patient Instructions (Signed)
It was great to see you!  You will be contacted about your referrals.  Good luck at your oncology appointment -- let me know if anything comes up.  Take care,  [name] PA-C

## 2020-02-22 NOTE — Progress Notes (Signed)
Alexandra Fitzpatrick is a 59 y.o. 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    History of Present Illness:   Chief Complaint  Patient presents with   Establish Care    HPI   Skin lesion Patient would like to have referral to dermatology. She has area on right arm and right side of face that she would like to have evaluated. Has history of eczema and folliculitis.   Need for pap Patient would like to have referral to GYN to establish. No current issues. She has history of breast cancer and is due for pap.    MDD Currently on Effexor 75 mg daily and does well with this. Was on Wellbutrin but had to d/c due to Tamoxifen. Denies SI/HI.  Depression screen [facility] 2/9 02/22/2020 06/29/2018  Decreased Interest 0 0  Down, Depressed, Hopeless 0 0  PHQ - 2 Score 0 0  Altered sleeping 0 -  Tired, decreased energy 0 -  Change in appetite 0 -  Feeling bad or failure about yourself  0 -  Trouble concentrating 0 -  Moving slowly or fidgety/restless 0 -  Suicidal thoughts 0 -  PHQ-9 Score 0 -  Difficult doing work/chores Not difficult at all -   Hyperglycemia; Obesity Most recent HgbA1c is 6.1%. She states that she is very motivated to work on diet and exercise -- she would like referral to Weight Mgmt. She gets her A1c checked at her oncology office, and is planning to have this visit soon. Recently bought a fitbit and is working on getting in [employer] steps daily.  Body mass index is 32.84 kg/m.   Lab Results  Component Value Date   HGBA1C 6.5 10/12/2018   Wt Readings from Last 3 Encounters:  02/22/20 216 lb (98 kg)  05/05/19 219 lb (99.3 kg)  04/27/19 217 lb (98.4 kg)       Health Maintenance Due  Topic Date Due   HIV Screening  Never done   MAMMOGRAM  02/18/2020      Past Medical History:  Diagnosis Date   Breast cancer ([place]) 2017   s/p surgery and radiation   Depression    Migraines      Social History   Tobacco Use   Smoking status: Never Smoker   Smokeless tobacco: Never Used  Substance Use Topics   Alcohol use: Not Currently   Drug use: Never    Past Surgical History:  Procedure Laterality Date   BREAST LUMPECTOMY Right 2017   BREAST SURGERY      Family History  Problem Relation Age of Onset   Cancer Mother    Early death Mother    Arthritis Father    Hypertension Father    Cancer Sister    Depression Sister    Arthritis Maternal Grandmother    Cancer Maternal Grandmother    Arthritis Maternal Grandfather    Cancer Maternal Grandfather    COPD Maternal Grandfather    Arthritis Paternal Grandmother    Cancer Paternal Grandmother    Arthritis Paternal Grandfather    Cancer Paternal Grandfather     PMHx, SurgHx, SocialHx, FamHx, Medications, and Allergies were reviewed in the Visit Navigator and updated as appropriate.   Patient Active Problem List   Diagnosis Date Noted   Migraine 02/22/2020  Use of tamoxifen (Nolvadex) 02/22/2020   Obesity 02/22/2020   Prediabetes 06/29/2018   MDD (major depressive disorder), recurrent, in full remission ([place]) 02/18/2017   Breast cancer of lower-outer quadrant of right female breast ([place]) 01/30/2016   KCS (keratoconjunctivitis sicca) ([place]) 05/10/2014   Myopic astigmatism of both eyes 05/10/2014    Social History   Tobacco Use   Smoking status: Never Smoker   Smokeless tobacco: Never Used  Substance Use Topics   Alcohol use: Not Currently   Drug use: Never    Current Medications and Allergies:    Current Outpatient Medications:    aspirin EC 81 MG tablet, Take 81 mg by mouth daily., Disp: , Rfl:    cholecalciferol (VITAMIN D) 400 units TABS tablet, Take 400 Units by mouth., Disp: , Rfl:    loratadine (CLARITIN) 10 MG tablet,  Take 10 mg by mouth daily., Disp: , Rfl:    Magnesium Citrate 100 MG TABS, Take by mouth., Disp: , Rfl:    Multiple Vitamins-Minerals (CENTRUM ADULTS PO), Take by mouth., Disp: , Rfl:    SUMAtriptan (IMITREX) 50 MG tablet, TAKE 1 TABLET BY MOUTH AS NEEDED FOR MIGRAINE, Disp: , Rfl: 2   tamoxifen (NOLVADEX) 20 MG tablet, Take 20 mg by mouth daily., Disp: , Rfl:    venlafaxine XR (EFFEXOR-XR) 75 MG 24 hr capsule, , Disp: , Rfl:    vitamin B-12 (CYANOCOBALAMIN) 1000 MCG tablet, Take 1,000 mcg by mouth daily., Disp: , Rfl:    Allergies  Allergen Reactions   Codeine Other (See Comments) and Shortness Of Breath    Review of Systems   Review of Systems  Constitutional: Negative for chills and fever.  HENT: Negative for ear pain, hearing loss and tinnitus.        History of eczema   Eyes: Negative for blurred vision and double vision.  Respiratory: Negative for cough, shortness of breath and wheezing.   Cardiovascular: Negative for chest pain, palpitations and leg swelling.  Gastrointestinal: Negative for constipation, diarrhea, heartburn, nausea and vomiting.  Genitourinary: Negative for dysuria, frequency and urgency.  Musculoskeletal: Negative for back pain, myalgias and neck pain.  Skin: Negative for rash.  Neurological: Negative for dizziness, seizures, weakness and headaches.  Endo/Heme/Allergies: Does not bruise/bleed easily.  Psychiatric/Behavioral: Negative for depression, memory loss and suicidal ideas. The patient does not have insomnia.     Vitals:   Vitals:   02/22/20 1425  BP: 140/88  Pulse: 69  Temp: 98.6 F (37 C)  TempSrc: Temporal  SpO2: 97%  Weight: 216 lb (98 kg)  Height: 5\' 8"  (1.727 m)     Body mass index is 32.84 kg/m.   Physical Exam:    Physical Exam Vitals and nursing note reviewed.  Constitutional:      General: She is not in acute distress.    Appearance: She is well-developed. She is not ill-appearing or toxic-appearing.    Cardiovascular:     Rate and Rhythm: Normal rate and regular rhythm.     Pulses: Normal pulses.     Heart sounds: Normal heart sounds, S1 normal and S2 normal.     Comments: No LE edema Pulmonary:     Effort: Pulmonary effort is normal.     Breath sounds: Normal breath sounds.  Skin:    General: Skin is warm and dry.     Comments: Rough skin-colored lesion to anterior r forearm Raised erythematous papule to R maxillary area  Neurological:     Mental Status: She is  alert.     GCS: GCS eye subscore is 4. GCS verbal subscore is 5. GCS motor subscore is 6.  Psychiatric:        Speech: Speech normal.        Behavior: Behavior normal. Behavior is cooperative.      Assessment and Plan:    Lujuana was seen today for establish care.  Diagnoses and all orders for this visit:  Skin lesion Referral to dermatology for further evaluation and mgmt. -     Ambulatory referral to Dermatology  Pap smear for cervical cancer screening Referral to gynecology. -     Ambulatory referral to Gynecology  Obesity, unspecified classification, unspecified obesity type, unspecified whether serious comorbidity present; Prediabetes Patient is very motivated. Referral to Healthy Weight and Wellness per patient request. Encouraged her to continue to work on diet and lifestyle changes. I recommend that she contact me when she gets her A1c results if she has concerns. -     Amb Ref to Medical Weight Management  MDD (major depressive disorder), recurrent, in full remission ([place]) Well controlled. Continue Effexor 75 mg daily. Follow-up in 6-12 months.    Reviewed expectations re: course of current medical issues.  Discussed self-management of symptoms.  Outlined signs and symptoms indicating need for more acute intervention.  Patient verbalized understanding and all questions were answered.  See orders for this visit as documented in the electronic medical record.  Patient received an After Visit  Summary.  CMA or LPN served as scribe during this visit. History, Physical, and Plan performed by medical provider. The above documentation has been reviewed and is accurate and complete.  Alexandra Coke, PA-C [place] [place] 02/22/2020  Follow-up: No follow-ups on file.

## 2020-02-23 ENCOUNTER — Encounter: Payer: Self-pay | Admitting: Obstetrics and Gynecology

## 2020-03-09 ENCOUNTER — Other Ambulatory Visit: Payer: Self-pay

## 2020-03-09 ENCOUNTER — Encounter (INDEPENDENT_AMBULATORY_CARE_PROVIDER_SITE_OTHER): Payer: Self-pay | Admitting: Family Medicine

## 2020-03-09 ENCOUNTER — Ambulatory Visit (INDEPENDENT_AMBULATORY_CARE_PROVIDER_SITE_OTHER): Payer: 59 | Admitting: Family Medicine

## 2020-03-09 VITALS — BP 135/80 | HR 79 | Temp 99.4°F | Ht 67.0 in | Wt 215.0 lb

## 2020-03-09 DIAGNOSIS — F3340 Major depressive disorder, recurrent, in remission, unspecified: Secondary | ICD-10-CM

## 2020-03-09 DIAGNOSIS — E559 Vitamin D deficiency, unspecified: Secondary | ICD-10-CM

## 2020-03-09 DIAGNOSIS — R5383 Other fatigue: Secondary | ICD-10-CM

## 2020-03-09 DIAGNOSIS — R7303 Prediabetes: Secondary | ICD-10-CM | POA: Diagnosis not present

## 2020-03-09 DIAGNOSIS — K529 Noninfective gastroenteritis and colitis, unspecified: Secondary | ICD-10-CM

## 2020-03-09 DIAGNOSIS — E782 Mixed hyperlipidemia: Secondary | ICD-10-CM | POA: Diagnosis not present

## 2020-03-09 DIAGNOSIS — E538 Deficiency of other specified B group vitamins: Secondary | ICD-10-CM

## 2020-03-09 DIAGNOSIS — K760 Fatty (change of) liver, not elsewhere classified: Secondary | ICD-10-CM

## 2020-03-09 DIAGNOSIS — R0602 Shortness of breath: Secondary | ICD-10-CM | POA: Diagnosis not present

## 2020-03-09 DIAGNOSIS — Z6833 Body mass index (BMI) 33.0-33.9, adult: Secondary | ICD-10-CM

## 2020-03-09 DIAGNOSIS — Z9189 Other specified personal risk factors, not elsewhere classified: Secondary | ICD-10-CM

## 2020-03-09 DIAGNOSIS — Z0289 Encounter for other administrative examinations: Secondary | ICD-10-CM

## 2020-03-09 DIAGNOSIS — R0683 Snoring: Secondary | ICD-10-CM

## 2020-03-09 DIAGNOSIS — E669 Obesity, unspecified: Secondary | ICD-10-CM

## 2020-03-09 DIAGNOSIS — Z853 Personal history of malignant neoplasm of breast: Secondary | ICD-10-CM

## 2020-03-09 NOTE — Progress Notes (Signed)
Dear Alexandra Coke, PA-C,   Thank you for referring Alexandra Fitzpatrick to our clinic. The following note includes my evaluation and treatment recommendations.  Chief Complaint:   OBESITY Alexandra Fitzpatrick (MR# [id number]) is a 59 y.o. female who presents for evaluation and treatment of obesity and related comorbidities. Current BMI is Body mass index is 33.67 kg/m. Alexandra Fitzpatrick has been struggling Alexandra her weight for many years and has been unsuccessful in either losing weight, maintaining weight loss, or reaching her healthy weight goal.  Alexandra Fitzpatrick is currently in the action stage of change and ready to dedicate time achieving and maintaining a healthier weight. Alexandra Fitzpatrick is interested in becoming our patient and working on intensive lifestyle modifications including (but not limited to) diet and exercise for weight loss.  Alexandra Fitzpatrick is a M[employer] for the [employer].  She works full time, 40 hours per week.  She is married and lives Alexandra her husband and daughter (44).  She says she walks for exercise.  She gets 5000-7000 steps per day.  She is vegetarian, practices IF.  Alexandra Fitzpatrick provided the following food recall today:  Coffee in the morning Alexandra cream and sugar. Breakfast:  After 10 am or skips.  Raisin Bran Crunch Alexandra milk. Lunch:  Deconstructed sandwich. Dinner:  Alexandra Bur out "way too much". Drinks mostly water.  Alexandra Fitzpatrick habits were reviewed today and are as follows: Her family eats meals together, she thinks her family will eat healthier Alexandra her, her desired weight loss is 63 pounds, she has been heavy most of her life, she started gaining weight in her late 1s, her heaviest weight ever was 262 pounds, she craves sweets, she is trying to follow a vegetarian diet, she is frequently drinking liquids Alexandra calories, she frequently makes poor food choices, she frequently eats larger portions than normal and she struggles Alexandra Fitzpatrick.  Depression Screen Alexandra Fitzpatrick Food and  Mood (modified PHQ-9) score was 10.  Depression screen PHQ 2/9 03/09/2020  Decreased Interest 2  Down, Depressed, Hopeless 3  PHQ - 2 Score 5  Altered sleeping 2  Tired, decreased energy 1  Change in appetite 1  Feeling bad or failure about yourself  0  Trouble concentrating 1  Moving slowly or fidgety/restless 0  Suicidal thoughts 0  PHQ-9 Score 10  Difficult doing work/chores Not difficult at all   Subjective:   1. Other fatigue Alexandra Fitzpatrick admits to daytime somnolence and reports waking up still tired. Patent has a history of symptoms of daytime fatigue, morning fatigue and snoring. Alexandra Fitzpatrick generally gets 5-8 hours of sleep per night, and states that she has poor quality sleep. Snoring is present. Apneic episodes are present. Epworth Sleepiness Score is 11.  2. SOB (shortness of breath) on exertion Alexandra Fitzpatrick notes increasing shortness of breath Alexandra exercising and seems to be worsening over time Alexandra weight gain. She notes getting out of breath sooner Alexandra activity than she used to. This has gotten worse recently. Alexandra Fitzpatrick denies shortness of breath at rest or orthopnea.  3. Prediabetes Alexandra Fitzpatrick has a diagnosis of prediabetes based on her elevated HgA1c and was informed this puts her at greater risk of developing diabetes. She continues to work on diet and exercise to decrease her risk of diabetes. She denies nausea or hypoglycemia.  A1c was 6.1 on 02/28/2020.  Lab Results  Component Value Date   HGBA1C 6.5 10/12/2018   4. Mixed hyperlipidemia Alexandra Fitzpatrick has hyperlipidemia and has been trying to improve her cholesterol levels  Alexandra intensive lifestyle modification including a low saturated Alexandra Fitzpatrick diet, exercise and weight loss. She denies any chest pain, claudication or myalgias.  HDL 45, triglycerides 205, LDL 106, total cholesterol 183 on 02/28/2020.  Lab Results  Component Value Date   ALT 33 04/27/2019   AST 32 04/27/2019   ALKPHOS 56 04/27/2019   BILITOT 0.8 04/27/2019   Lab Results    Component Value Date   CHOL 199 10/12/2018   HDL 38.90 (L) 10/12/2018   LDLDIRECT 130.0 10/12/2018   TRIG 299.0 (H) 10/12/2018   CHOLHDL 5 10/12/2018   5. Vitamin D deficiency Alexandra Fitzpatrick's Vitamin D level was 48 on 02/28/2020. She is currently taking OTC vitamin D 1000 IU each day. She denies nausea, vomiting or muscle weakness.  6. Colitis Alexandra Fitzpatrick suffers from colitis.  She has requested a referral to GI.  7. Fatty liver Alexandra Fitzpatrick has the diagnosis of fatty liver. Her BMI is over 33.8. She denies abdominal pain or jaundice and has never been told of any liver problems in the past. She denies excessive alcohol intake.  Lab Results  Component Value Date   ALT 33 04/27/2019   AST 32 04/27/2019   ALKPHOS 56 04/27/2019   BILITOT 0.8 04/27/2019   8. B12 deficiency She takes vitamin B12 1000 mcg daily.  B12 level is at goal.  Lab Results  Component Value Date   VITAMINB12 476 10/12/2018   9. Snores Alexandra Fitzpatrick snores and has apneic episodes.  Epworth score is 11.  Situation Chance of Dozing or Sleeping  Sitting and reading 3 = high chance of dozing or sleeping  Watching television 1 = slight chance of dozing or sleeping  Sitting inactive in a public place (theater or meeting) 0 = would never doze or sleep  Sitting as a passenger in a car for an hour 1 = slight chance of dozing or sleeping  Lying down in the afternoon when circumstances permit 3 = high chance of dozing or sleeping  Sitting and talking to someone 0 = would never doze or sleep  Sitting quietly after lunch without alcohol 3 = high chance of dozing or sleeping  In a car, while stopped for a few minutes in traffic 0 = would never doze or sleep  TOTAL 11   10. History of right breast cancer Alexandra Fitzpatrick is followed by Oncology.  She is taking tamoxifen.  11. Recurrent major depressive disorder, in remission Alexandra Fitzpatrick), Alexandra Fitzpatrick Currently in remission.  Controlled Alexandra Effexor.  Alexandra Fitzpatrick is struggling Alexandra Fitzpatrick and  using food for comfort to the extent that it is negatively impacting her health. She has been working on behavior modification techniques to help reduce her emotional Fitzpatrick and has been unsuccessful. She shows no sign of suicidal or homicidal ideations.  PHQ-9 is 10.  Assessment/Plan:   1. Other fatigue Alexandra Fitzpatrick does feel that her weight is causing her energy to be lower than it should be. Fatigue may be related to obesity, depression or many other causes. Labs will be ordered, and in the meanwhile, Blimy will focus on self care including making healthy food choices, increasing physical activity and focusing on stress reduction.  Orders - EKG 12-Lead  2. SOB (shortness of breath) on exertion Alexandra Fitzpatrick does feel that she gets out of breath more easily that she used to when she exercises. Alexandra Fitzpatrick shortness of breath appears to be obesity related and exercise induced. She has agreed to work on weight loss and gradually increase exercise to treat her  exercise induced shortness of breath. Will continue to monitor closely.  3. Prediabetes Alexandra Fitzpatrick will continue to work on weight loss, exercise, and decreasing simple carbohydrates to help decrease the risk of diabetes.   4. Mixed hyperlipidemia Cardiovascular risk and specific lipid/LDL goals reviewed.  We discussed several lifestyle modifications today and Alexandra Fitzpatrick will continue to work on diet, exercise and weight loss efforts. Orders and follow up as documented in patient record.   Counseling Intensive lifestyle modifications are the first line treatment for this issue. . Dietary changes: Increase soluble fiber. Decrease simple carbohydrates. . Exercise changes: Moderate to vigorous-intensity aerobic activity 150 minutes per week if tolerated. . Lipid-lowering medications: see documented in medical record.  5. Vitamin D deficiency Low Vitamin D level contributes to fatigue and are associated Alexandra obesity, breast, and colon cancer. She agrees to  continue to take OTC Vitamin D @1 ,000 IU daily and will follow-up for routine testing of Vitamin D, at least 2-3 times per year to avoid over-replacement.  6. Colitis Will place referral to GI at patient's request.  Orders - Ambulatory referral to Gastroenterology  7. Fatty liver Alexandra Fitzpatrick was educated the importance of weight loss. Fatima agreed to continue Alexandra her weight loss efforts Alexandra healthier diet and exercise as an essential part of her treatment plan.  8. B12 deficiency The diagnosis was reviewed Alexandra the patient. Counseling provided today, see below. We will continue to monitor. Orders and follow up as documented in patient record.  Counseling . The body needs vitamin B12: to make red blood cells; to make DNA; and to help the nerves work properly so they can carry messages from the brain to the body.  . The main causes of vitamin B12 deficiency include dietary deficiency, digestive diseases, pernicious anemia, and having a surgery in which part of the stomach or small intestine is removed.  . Certain medicines can make it harder for the body to absorb vitamin B12. These medicines include: heartburn medications; some antibiotics; some medications used to treat diabetes, gout, and high cholesterol.  . In some cases, there are no symptoms of this condition. If the condition leads to anemia or nerve damage, various symptoms can occur, such as weakness or fatigue, shortness of breath, and numbness or tingling in your hands and feet.   . Treatment:  o May include taking vitamin B12 supplements.  o Avoid alcohol.  o Eat lots of healthy foods that contain vitamin B12: - Beef, pork, chicken, [place], and organ meats, such as liver.  - Seafood: This includes clams, rainbow trout, salmon, tuna, and haddock. Eggs.  - Cereal and dairy products that are fortified: This means that vitamin B12 has been added to the food.   9. Snores We will continue to monitor.  10. History of right breast  cancer Followed by Oncology for this problem. Those encounter notes were reviewed.  11. Recurrent major depressive disorder, in remission [facility]), Alexandra Fitzpatrick Behavior modification techniques were discussed today to help Adali deal Alexandra her emotional/non-hunger Fitzpatrick behaviors.  Orders and follow up as documented in patient record.   12. At risk for heart disease Alexandra Fitzpatrick was given approximately 15 minutes of coronary artery disease prevention counseling today. She is 59 y.o. female and has risk factors for heart disease including obesity. We discussed intensive lifestyle modifications today Alexandra an emphasis on specific weight loss instructions and strategies.   Repetitive spaced learning was employed today to elicit superior memory formation and behavioral change.  13. Class 1 obesity  Alexandra serious comorbidity and body mass index (BMI) of 33.0 to 33.9 in adult, unspecified obesity type [place] is currently in the action stage of change and her goal is to continue Alexandra weight loss efforts. I recommend Briceyda begin the structured treatment plan as follows:  She has agreed to keeping a food journal and adhering to recommended goals of 1500 calories.  Exercise goals: As is.   Behavioral modification strategies: increasing lean protein intake, decreasing simple carbohydrates, increasing vegetables, increasing water intake, decreasing liquid calories, decreasing sodium intake and increasing high fiber foods.  She was informed of the importance of frequent follow-up visits to maximize her success Alexandra intensive lifestyle modifications for her multiple health conditions. She was informed we would discuss her lab results at her next visit unless there is a critical issue that needs to be addressed sooner. Angelee agreed to keep her next visit at the agreed upon time to discuss these results.  Objective:   Blood pressure 135/80, pulse 79, temperature 99.4 F (37.4 C), temperature source Oral,  height 5\' 7"  (1.702 m), weight 215 lb (97.5 kg), SpO2 97 %. Body mass index is 33.67 kg/m.  EKG: Normal sinus rhythm, rate 83 bpm.  Indirect Calorimeter completed today shows a VO2 of 273 and a REE of 1903.  Her calculated basal metabolic rate is 9242 thus her basal metabolic rate is better than expected.  General: Cooperative, alert, well developed, in no acute distress. HEENT: Conjunctivae and lids unremarkable. Cardiovascular: Regular rhythm.  Lungs: Normal work of breathing. Neurologic: No focal deficits.   Lab Results  Component Value Date   CREATININE 0.87 04/27/2019   BUN 10 04/27/2019   NA 138 04/27/2019   K 3.5 04/27/2019   CL 104 04/27/2019   CO2 22 04/27/2019   Lab Results  Component Value Date   ALT 33 04/27/2019   AST 32 04/27/2019   ALKPHOS 56 04/27/2019   BILITOT 0.8 04/27/2019   Lab Results  Component Value Date   HGBA1C 6.5 10/12/2018   HGBA1C 5.8 (A) 06/29/2018   Lab Results  Component Value Date   CHOL 199 10/12/2018   HDL 38.90 (L) 10/12/2018   LDLDIRECT 130.0 10/12/2018   TRIG 299.0 (H) 10/12/2018   CHOLHDL 5 10/12/2018   Lab Results  Component Value Date   WBC 15.3 (H) 04/27/2019   HGB 14.1 04/27/2019   HCT 42.4 04/27/2019   MCV 91.8 04/27/2019   PLT 239 04/27/2019   Attestation Statements:   This is the patient's first visit at [facility]. The patient's NEW PATIENT PACKET was reviewed at length. Included in the packet: current and past health history, medications, allergies, ROS, gynecologic history (women only), surgical history, family history, social history, weight history, weight loss surgery history (for those that have had weight loss surgery), nutritional evaluation, mood and food questionnaire, PHQ9, Epworth questionnaire, sleep habits questionnaire, patient life and health improvement goals questionnaire. These will all be scanned into the patient's chart under media.   During the visit, I independently reviewed  the patient's EKG, bioimpedance scale results, and indirect calorimeter results. I used this information to tailor a meal plan for the patient that will help her to lose weight and will improve her obesity-related conditions going forward. I performed a medically necessary appropriate examination and/or evaluation. I discussed the assessment and treatment plan Alexandra the patient. The patient was provided an opportunity to ask questions and all were answered. The patient agreed Alexandra the plan and demonstrated an understanding  of the instructions. Labs were ordered at this visit and will be reviewed at the next visit unless more critical results need to be addressed immediately. Clinical information was updated and documented in the EMR.   I, Water quality scientist, CMA, am acting as transcriptionist for Briscoe Deutscher, DO  I have reviewed the above documentation for accuracy and completeness, and I agree Alexandra the above. Briscoe Deutscher, DO

## 2020-03-14 ENCOUNTER — Encounter: Payer: Self-pay | Admitting: Nurse Practitioner

## 2020-03-23 ENCOUNTER — Ambulatory Visit (INDEPENDENT_AMBULATORY_CARE_PROVIDER_SITE_OTHER): Payer: 59 | Admitting: Family Medicine

## 2020-03-23 ENCOUNTER — Encounter (INDEPENDENT_AMBULATORY_CARE_PROVIDER_SITE_OTHER): Payer: Self-pay | Admitting: Family Medicine

## 2020-03-23 ENCOUNTER — Other Ambulatory Visit: Payer: Self-pay

## 2020-03-23 VITALS — BP 125/74 | HR 84 | Temp 99.2°F | Ht 67.0 in | Wt 214.0 lb

## 2020-03-23 DIAGNOSIS — Z853 Personal history of malignant neoplasm of breast: Secondary | ICD-10-CM

## 2020-03-23 DIAGNOSIS — Z6833 Body mass index (BMI) 33.0-33.9, adult: Secondary | ICD-10-CM

## 2020-03-23 DIAGNOSIS — Z9189 Other specified personal risk factors, not elsewhere classified: Secondary | ICD-10-CM

## 2020-03-23 DIAGNOSIS — E669 Obesity, unspecified: Secondary | ICD-10-CM

## 2020-03-23 DIAGNOSIS — R7303 Prediabetes: Secondary | ICD-10-CM | POA: Diagnosis not present

## 2020-03-23 DIAGNOSIS — K760 Fatty (change of) liver, not elsewhere classified: Secondary | ICD-10-CM | POA: Diagnosis not present

## 2020-03-25 ENCOUNTER — Encounter: Payer: Self-pay | Admitting: Physician Assistant

## 2020-03-27 MED ORDER — VENLAFAXINE HCL ER 75 MG PO CP24: 75.0000 mg | ORAL_CAPSULE | Freq: Every day | ORAL | 2 refills | Status: DC

## 2020-03-27 NOTE — Telephone Encounter (Signed)
Okay to refill Effexor-ER?

## 2020-03-28 NOTE — Progress Notes (Signed)
Chief Complaint:   OBESITY Alexandra Fitzpatrick is here to discuss her progress with her obesity treatment plan along with follow-up of her obesity related diagnoses. Alexandra Fitzpatrick is on the Category 3 Plan and states she is following her eating plan approximately 60% of the time. Alexandra Fitzpatrick states she has increased her activity and amount of steps.  Today's visit was #: 2 Starting weight: 215 lbs Starting date: 03/09/2020 Today's weight: 214 lbs Today's date: 03/23/2020 Total lbs lost to date: 1 lb Total lbs lost since last in-office visit: 1 lb  Interim History: Alexandra Fitzpatrick says she will be getting a Freshly delivery tomorrow.  Subjective:   1. Prediabetes Kit has a diagnosis of prediabetes based on her elevated HgA1c and was informed this puts her at greater risk of developing diabetes. She continues to work on diet and exercise to decrease her risk of diabetes. She denies nausea or hypoglycemia.  Lab Results  Component Value Date   HGBA1C 6.5 10/12/2018   2. Fatty liver Alexandra Fitzpatrick has fatty liver disease.  She denies abdominal pain or jaundice and has never been told of any liver problems in the past. She denies excessive alcohol intake.  Lab Results  Component Value Date   ALT 33 04/27/2019   AST 32 04/27/2019   ALKPHOS 56 04/27/2019   BILITOT 0.8 04/27/2019   3. History of right breast cancer Alexandra Fitzpatrick has a history of right breast cancer and is taking tamoxifen.  4. At risk for heart disease Alexandra Fitzpatrick is at a higher than average risk for cardiovascular disease due to obesity.   Assessment/Plan:   1. Prediabetes Alexandra Fitzpatrick will continue to work on weight loss, exercise, and decreasing simple carbohydrates to help decrease the risk of diabetes.   2. Fatty liver Alexandra Fitzpatrick was educated the importance of weight loss. Alexandra Fitzpatrick agreed to continue with her weight loss efforts with healthier diet and exercise as an essential part of her treatment plan.  3. History of right breast cancer Followed by Oncology  for this problem. Those encounter notes were reviewed.  4. At risk for heart disease Alexandra Fitzpatrick was given approximately 15 minutes of coronary artery disease prevention counseling today. She is 59 y.o. female and has risk factors for heart disease including obesity. We discussed intensive lifestyle modifications today with an emphasis on specific weight loss instructions and strategies.   Repetitive spaced learning was employed today to elicit superior memory formation and behavioral change.  5. Class 1 obesity with serious comorbidity and body mass index (BMI) of 33.0 to 33.9 in adult, unspecified obesity type Alexandra Fitzpatrick is currently in the action stage of change. As such, her goal is to continue with weight loss efforts. She has agreed to the Category 3 Plan.   Exercise goals: PT with Fitzmaurice 3 times per week.  Behavioral modification strategies: increasing lean protein intake, decreasing simple carbohydrates, increasing vegetables and increasing water intake.  Alexandra Fitzpatrick has agreed to follow-up with our clinic in 2-3 weeks. She was informed of the importance of frequent follow-up visits to maximize her success with intensive lifestyle modifications for her multiple health conditions.   Objective:   Blood pressure 125/74, pulse 84, temperature 99.2 F (37.3 C), temperature source Oral, height 5\' 7"  (1.702 m), weight 214 lb (97.1 kg), SpO2 96 %. Body mass index is 33.52 kg/m.  General: Cooperative, alert, well developed, in no acute distress. HEENT: Conjunctivae and lids unremarkable. Cardiovascular: Regular rhythm.  Lungs: Normal work of breathing. Neurologic: No focal deficits.   Lab Results  Component Value Date   CREATININE 0.87 04/27/2019   BUN 10 04/27/2019   NA 138 04/27/2019   K 3.5 04/27/2019   CL 104 04/27/2019   CO2 22 04/27/2019   Lab Results  Component Value Date   ALT 33 04/27/2019   AST 32 04/27/2019   ALKPHOS 56 04/27/2019   BILITOT 0.8 04/27/2019   Lab Results    Component Value Date   HGBA1C 6.5 10/12/2018   HGBA1C 5.8 (A) 06/29/2018   Lab Results  Component Value Date   CHOL 199 10/12/2018   HDL 38.90 (L) 10/12/2018   LDLDIRECT 130.0 10/12/2018   TRIG 299.0 (H) 10/12/2018   CHOLHDL 5 10/12/2018   Lab Results  Component Value Date   WBC 15.3 (H) 04/27/2019   HGB 14.1 04/27/2019   HCT 42.4 04/27/2019   MCV 91.8 04/27/2019   PLT 239 04/27/2019   Attestation Statements:   Reviewed by clinician on day of visit: allergies, medications, problem list, medical history, surgical history, family history, social history, and previous encounter notes.  I, Water quality scientist, CMA, am acting as transcriptionist for Briscoe Deutscher, DO  I have reviewed the above documentation for accuracy and completeness, and I agree with the above. Briscoe Deutscher, DO

## 2020-04-10 ENCOUNTER — Ambulatory Visit (INDEPENDENT_AMBULATORY_CARE_PROVIDER_SITE_OTHER): Payer: 59 | Admitting: Physician Assistant

## 2020-04-10 ENCOUNTER — Encounter (INDEPENDENT_AMBULATORY_CARE_PROVIDER_SITE_OTHER): Payer: Self-pay | Admitting: Physician Assistant

## 2020-04-10 ENCOUNTER — Other Ambulatory Visit: Payer: Self-pay

## 2020-04-10 VITALS — BP 146/78 | HR 52 | Temp 98.1°F | Ht 67.0 in | Wt 212.0 lb

## 2020-04-10 DIAGNOSIS — R7303 Prediabetes: Secondary | ICD-10-CM

## 2020-04-10 DIAGNOSIS — E669 Obesity, unspecified: Secondary | ICD-10-CM | POA: Diagnosis not present

## 2020-04-10 DIAGNOSIS — Z9189 Other specified personal risk factors, not elsewhere classified: Secondary | ICD-10-CM

## 2020-04-10 DIAGNOSIS — E559 Vitamin D deficiency, unspecified: Secondary | ICD-10-CM | POA: Diagnosis not present

## 2020-04-10 DIAGNOSIS — E66811 Obesity, class 1: Secondary | ICD-10-CM

## 2020-04-10 DIAGNOSIS — Z6833 Body mass index (BMI) 33.0-33.9, adult: Secondary | ICD-10-CM

## 2020-04-10 NOTE — Progress Notes (Signed)
Chief Complaint:   OBESITY Alexandra Fitzpatrick is here to discuss her progress with her obesity treatment plan along with follow-up of her obesity related diagnoses. Amarys is on the Category 3 Plan and states she is following her eating plan approximately 80% of the time. Jelitza states she is walking 30 minutes 2-3 times per week and doing video exercises 30 minutes 3 times per week.  Today's visit was #: 3 Starting weight: 215 lbs Starting date: 03/09/2020 Today's weight: 212 lbs Today's date: 04/10/2020 Total lbs lost to date: 3 Total lbs lost since last in-office visit: 2  Interim History: Lafern states that she follows the plan for breakfast and lunch and has been doing Freshly meals for dinner. She does not like to snack between meals because she states that she can't control her snacking.  Subjective:   Prediabetes. Caddie has a diagnosis of prediabetes based on her elevated HgA1c and was informed this puts her at greater risk of developing diabetes. She continues to work on diet and exercise to decrease her risk of diabetes. She denies nausea or hypoglycemia. Relda is on no medication. No polyphagia.  Lab Results  Component Value Date   HGBA1C 6.5 10/12/2018   No results found for: INSULIN  Vitamin D deficiency. Lilianne is on OTC Vitamin D supplementation.   At risk for diabetes mellitus. Karra is at higher than average risk for developing diabetes due to her obesity.   Assessment/Plan:   Prediabetes. Tarica will continue to work on weight loss, exercise, and decreasing simple carbohydrates to help decrease the risk of diabetes.   Vitamin D deficiency. Low Vitamin D level contributes to fatigue and are associated with obesity, breast, and colon cancer. She agrees to continue to take OTC Vitamin D as directed and will follow-up for routine testing of Vitamin D, at least 2-3 times per year to avoid over-replacement.  At risk for diabetes mellitus. Shaun was given  approximately 15 minutes of diabetes education and counseling today. We discussed intensive lifestyle modifications today with an emphasis on weight loss as well as increasing exercise and decreasing simple carbohydrates in her diet. We also reviewed medication options with an emphasis on risk versus benefit of those discussed.   Repetitive spaced learning was employed today to elicit superior memory formation and behavioral change.  Class 1 obesity with serious comorbidity and body mass index (BMI) of 33.0 to 33.9 in adult, unspecified obesity type.  Ane is currently in the action stage of change. As such, her goal is to continue with weight loss efforts. She has agreed to the Category 3 Plan.   Exercise goals: For substantial health benefits, adults should do at least 150 minutes (2 hours and 30 minutes) a week of moderate-intensity, or 75 minutes (1 hour and 15 minutes) a week of vigorous-intensity aerobic physical activity, or an equivalent combination of moderate- and vigorous-intensity aerobic activity. Aerobic activity should be performed in episodes of at least 10 minutes, and preferably, it should be spread throughout the week.  Behavioral modification strategies: meal planning and cooking strategies and planning for success.  Terrin has agreed to follow-up with our clinic in 2 weeks. She was informed of the importance of frequent follow-up visits to maximize her success with intensive lifestyle modifications for her multiple health conditions.   Objective:   Blood pressure (!) 146/78, pulse (!) 52, temperature 98.1 F (36.7 C), temperature source Oral, height 5\' 7"  (1.702 m), weight 212 lb (96.2 kg), SpO2 100 %.  Body mass index is 33.2 kg/m.  General: Cooperative, alert, well developed, in no acute distress. HEENT: Conjunctivae and lids unremarkable. Cardiovascular: Regular rhythm.  Lungs: Normal work of breathing. Neurologic: No focal deficits.   Lab Results  Component Value  Date   CREATININE 0.87 04/27/2019   BUN 10 04/27/2019   NA 138 04/27/2019   K 3.5 04/27/2019   CL 104 04/27/2019   CO2 22 04/27/2019   Lab Results  Component Value Date   ALT 33 04/27/2019   AST 32 04/27/2019   ALKPHOS 56 04/27/2019   BILITOT 0.8 04/27/2019   Lab Results  Component Value Date   HGBA1C 6.5 10/12/2018   HGBA1C 5.8 (A) 06/29/2018   No results found for: INSULIN No results found for: TSH Lab Results  Component Value Date   CHOL 199 10/12/2018   HDL 38.90 (L) 10/12/2018   LDLDIRECT 130.0 10/12/2018   TRIG 299.0 (H) 10/12/2018   CHOLHDL 5 10/12/2018   Lab Results  Component Value Date   WBC 15.3 (H) 04/27/2019   HGB 14.1 04/27/2019   HCT 42.4 04/27/2019   MCV 91.8 04/27/2019   PLT 239 04/27/2019   No results found for: IRON, TIBC, FERRITIN  Attestation Statements:   Reviewed by clinician on day of visit: allergies, medications, problem list, medical history, surgical history, family history, social history, and previous encounter notes.  IMichaelene Song, am acting as transcriptionist for Abby Potash, PA-C   I have reviewed the above documentation for accuracy and completeness, and I agree with the above. Abby Potash, PA-C

## 2020-04-11 NOTE — Progress Notes (Signed)
04/11/2020 Alexandra Fitzpatrick [id number] Aug 05, 1961   CHIEF COMPLAINT: schedule a colonoscopy   HISTORY OF PRESENT ILLNESS: Alexandra Fitzpatrick is a 59 year old female with a past medical history of anxiety, depression, obesity, prediabetes, fatty liver, breast cancer initially diagnosed in 2017 s/p right breast lumpectomy 03/15/2016 with radiation x 6 weeks then Tamoxifen started 07/09/2020, migraine headache, fatty liver and vitamin D deficiency. She was seen by her oncology NP at the [facility], [place] 02/29/2020 and she was advised to schedule a colonoscopy due to having colitis 04/2019. She developed upper and lower abdominal pain with nausea and vomiting and bloody diarrhea 04/27/2019. She presented to [facility] in [place] for further evaluation. Labs in the ED showed WBC 15.3. Hg 14.1. Lactic acid level was normal. An abdominal/pelvic CT scan identified thickening of the walls to the left colon and distal transverse colon with associated pericolonic inflammation/fluid stranding.  Findings were consistent with acute colitis or infectious, inflammatory or ischemic etiology.  She was treated with antibiotics and her symptoms resolved without recurrence.  She underwent [facility] [place] [facility] which she reported was normal.  She previously lived in  [place] then moved to [place]. 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  Her paternal grandmother was diagnosed with colon cancer in her late 29s.  She reports having intermittent constipation which started after initiating Tamoxifen in 2017.  She had severe constipation in 2018 which resulted in no near fecal impaction.  She started taking MiraLAX daily which caused frequent gassy loose stools she stopped taking it.  She increased her dietary fiber.  She mostly passes pebble-like stools each time she urinates.  No rectal bleeding or melena. No weight loss. No recent lower abdominal pain. No dysphagia, heartburn or  upper abdominal pain. No other complaints today.   Labs 02/28/2020: Hg 13.1. HCT 37.3.  CMP was normal.  CEA 1.3.  Abdominal/pelvic CT 04/27/2019: 1. Thickening of the walls of the LEFT colon and distal transverse colon, with associated pericolonic inflammation/fluid stranding. Findings are consistent with acute colitis of infectious, inflammatory or ischemic nature. No associated bowel obstruction. No abscess collection seen. No free intraperitoneal air. 2. Fatty infiltration of the liver. 3. Probable small uterine fibroids. 4. Remainder of the abdomen and pelvis CT is unremarkable, as detailed above  Past Medical History:  Diagnosis Date  . Abnormal cholesterol test   . Allergies   . Anxiety   . B12 deficiency   . Back pain   . Breast cancer ([place]) 2017   s/p surgery and radiation  . Cancer ([place])   . Constipation   . Depression   . Depression   . Fatty liver   . High blood pressure   . High blood sugar   . High triglycerides   . Migraines   . Pre-diabetes   . Scheuermann's disease   . Sleep apnea   . Vitamin D deficiency    Past Surgical History:  Procedure Laterality Date  . BREAST LUMPECTOMY Right 2017  . BREAST SURGERY    . WISDOM TOOTH EXTRACTION      Social History: She is a non-smoker.  Infrequent alcohol use.  No history of drug use.  Family History: Sister with history of breast cancer. Paternal grandmother had colon cancer.   Allergies  Allergen Reactions  . Codeine Other (See Comments) and Shortness Of Breath      Outpatient Encounter Medications as of 04/12/2020  Medication Sig  .  aspirin EC 81 MG tablet Take 81 mg by mouth daily.  . carboxymethylcellulose (REFRESH PLUS) 0.5 % SOLN 1 drop 3 (three) times daily as needed.  . Cholecalciferol 25 MCG (1000 UT) tablet Take 1,000 Units by mouth daily.   Marland Kitchen ketoconazole (NIZORAL) 2 % shampoo Apply topically.  Marland Kitchen loratadine (CLARITIN) 10 MG tablet Take 10 mg by mouth daily.  . Magnesium Citrate 100 MG TABS  Take by mouth.  . Multiple Vitamins-Minerals (CENTRUM ADULTS PO) Take by mouth.  . SUMAtriptan (IMITREX) 50 MG tablet TAKE 1 TABLET BY MOUTH AS NEEDED FOR MIGRAINE  . tamoxifen (NOLVADEX) 20 MG tablet Take 20 mg by mouth daily.  Marland Kitchen venlafaxine XR (EFFEXOR-XR) 75 MG 24 hr capsule Take 1 capsule (75 mg total) by mouth daily with breakfast.  . vitamin B-12 (CYANOCOBALAMIN) 1000 MCG tablet Take 1,000 mcg by mouth daily.   No facility-administered encounter medications on file as of 04/12/2020.     REVIEW OF SYSTEMS: All other systems reviewed and negative except where noted in the History of Present Illness.   PHYSICAL EXAM: BP 106/70 (BP Location: Left Arm, Patient Position: Sitting, Cuff Size: Normal)   Pulse 80   Ht 5\' 7"  (1.702 m) Comment: height measured without shoes  Wt 213 lb 4 oz (96.7 kg)   LMP 10/11/2016   BMI 33.40 kg/m   General: Well developed  59 year old female in no acute distress. Head: Normocephalic and atraumatic. Eyes:  Sclerae non-icteric, conjunctive pink. Ears: Normal auditory acuity. Mouth: Dentition intact. No ulcers or lesions.  Neck: Supple, no lymphadenopathy or thyromegaly.  Lungs: Clear bilaterally to auscultation without wheezes, crackles or rhonchi. Heart: Regular rate and rhythm. No murmur, rub or gallop appreciated.  Abdomen: Soft, nontender, non distended. No masses. No hepatosplenomegaly. Normoactive bowel sounds x 4 quadrants.  Rectal: Deferred. Musculoskeletal: Symmetrical with no gross deformities. Skin: Warm and dry. No rash or lesions on visible extremities. Extremities: No edema. Neurological: Alert oriented x 4, no focal deficits.  Psychological:  Alert and cooperative. Normal mood and affect.  ASSESSMENT AND PLAN:  51. 59 year old female presents to schedule a colonoscopy due to having an abnormal CTAP 04/2019 which identified acute colitis to the distal transverse colon and left colon (most likely infectious colitis with associated N/V).  Positive family history of colon cancer (paternal grandmother) -Take Miralax Q HS for one week prior to day of colonoscopy prep -Colonoscopy benefits and risks discussed including risk with sedation, risk of bleeding, perforation and infection  -Further follow-up to be determined after colonoscopy and plated  2. Constipation -Metamucil and Miralax as tolerated   3. Personal history of breast cancer s/p right breast lumpectomy and radiation 2017 on Tamoxifen.   4. Fatty liver per CTAP 04/2019. Normal LFTs. -Continue weight loss regimen    CC:  [name], [place]

## 2020-04-12 ENCOUNTER — Encounter: Payer: Self-pay | Admitting: Nurse Practitioner

## 2020-04-12 ENCOUNTER — Ambulatory Visit: Payer: 59 | Admitting: Nurse Practitioner

## 2020-04-12 VITALS — BP 106/70 | HR 80 | Ht 67.0 in | Wt 213.2 lb

## 2020-04-12 DIAGNOSIS — R9389 Abnormal findings on diagnostic imaging of other specified body structures: Secondary | ICD-10-CM

## 2020-04-12 DIAGNOSIS — K529 Noninfective gastroenteritis and colitis, unspecified: Secondary | ICD-10-CM | POA: Diagnosis not present

## 2020-04-12 DIAGNOSIS — Z853 Personal history of malignant neoplasm of breast: Secondary | ICD-10-CM

## 2020-04-12 DIAGNOSIS — Z800 Family history of malignant neoplasm of digestive organs: Secondary | ICD-10-CM | POA: Diagnosis not present

## 2020-04-12 MED ORDER — NA SULFATE-K SULFATE-MG SULF 17.5-3.13-1.6 GM/177ML PO SOLN: 1.0000 | Freq: Once | ORAL | 0 refills | Status: AC

## 2020-04-12 NOTE — Patient Instructions (Signed)
If you are age 57 or older, your body mass index should be between 23-30. Your Body mass index is 33.4 kg/m. If this is out of the aforementioned range listed, please consider follow up with your Primary Care Provider.  If you are age 14 or younger, your body mass index should be between 19-25. Your Body mass index is 33.4 kg/m. If this is out of the aformentioned range listed, please consider follow up with your Primary Care Provider.   Take Miralax 1 capful mixed in 8 ounces of water at bed time for constipation as tolerated.   Please be sure to use the miralax as instructed above for 1 week before your colonoscopy.   We have sent the following medications to your pharmacy for you to pick up at your convenience:  suprep for colonoscopy  Due to recent changes in healthcare laws, you may see the results of your imaging and laboratory studies on MyChart before your provider has had a chance to review them.  We understand that in some cases there may be results that are confusing or concerning to you. Not all laboratory results come back in the same time frame and the provider may be waiting for multiple results in order to interpret others.  Please give [place] 48 hours in order for your provider to thoroughly review all the results before contacting the office for clarification of your results.

## 2020-04-24 ENCOUNTER — Encounter (INDEPENDENT_AMBULATORY_CARE_PROVIDER_SITE_OTHER): Payer: Self-pay | Admitting: Family Medicine

## 2020-04-24 ENCOUNTER — Other Ambulatory Visit: Payer: Self-pay

## 2020-04-24 ENCOUNTER — Ambulatory Visit (INDEPENDENT_AMBULATORY_CARE_PROVIDER_SITE_OTHER): Payer: 59 | Admitting: Family Medicine

## 2020-04-24 VITALS — BP 131/81 | HR 68 | Temp 98.3°F | Ht 67.0 in | Wt 208.0 lb

## 2020-04-24 DIAGNOSIS — Z6832 Body mass index (BMI) 32.0-32.9, adult: Secondary | ICD-10-CM

## 2020-04-24 DIAGNOSIS — Z9189 Other specified personal risk factors, not elsewhere classified: Secondary | ICD-10-CM

## 2020-04-24 DIAGNOSIS — R7303 Prediabetes: Secondary | ICD-10-CM

## 2020-04-24 DIAGNOSIS — E669 Obesity, unspecified: Secondary | ICD-10-CM

## 2020-04-24 DIAGNOSIS — E782 Mixed hyperlipidemia: Secondary | ICD-10-CM

## 2020-04-24 DIAGNOSIS — E559 Vitamin D deficiency, unspecified: Secondary | ICD-10-CM

## 2020-04-24 DIAGNOSIS — K760 Fatty (change of) liver, not elsewhere classified: Secondary | ICD-10-CM | POA: Diagnosis not present

## 2020-04-24 NOTE — Progress Notes (Signed)
Reviewed and agree with documentation and assessment and plan. K. Veena Ilze Roselli , MD   

## 2020-04-24 NOTE — Progress Notes (Signed)
Chief Complaint:   OBESITY Alexandra Fitzpatrick is here to discuss her progress with her obesity treatment plan along with follow-up of her obesity related diagnoses. Alexandra Fitzpatrick is on the Category 3 Plan and states she is following her eating plan approximately 80% of the time. Alexandra Fitzpatrick states she is walking for 35 minutes 3 times per week.  Today's visit was #: 4 Starting weight: 215 lbs Starting date: 03/09/2020 Today's weight: 208 lbs Today's date: 04/24/2020 Total lbs lost to date: 7 lbs Total lbs lost since last in-office visit: 4 lbs Total weight loss percentage to date: -3.26%  Interim History: Alexandra Fitzpatrick says she has an upcoming colonoscopy in October.  She had colitis several months ago.  No new issues.  She says she is happy with the plan.  Denies polyphagia.  She is still working too much, but says she is exercising more often.  Assessment/Plan:   1. Vitamin D deficiency Optimal goal > 50 ng/dL. There is also evidence to support a goal of >70 ng/dL in patients with cancer and heart disease. Plan: Continue Vitamin D @50 ,000 IU every week with follow-up for routine testing of Vitamin D at least 2-3 times per year to avoid over-replacement. She agrees to continue to take OTC Vitamin D @1 ,000 IU daily and will follow-up for routine testing of Vitamin D, at least 2-3 times per year to avoid over-replacement.  2. Prediabetes Not at goal.Goal is HgbA1c < 5.7 and insulin level closer to 5. Alexandra Fitzpatrick will continue to work on weight loss, exercise, and decreasing simple carbohydrates to help decrease the risk of diabetes.   3. Fatty liver Treatment goal: 7-10% reduction of body weight.  Aerobic and resistance physical activity are important to help achieve a healthy body weight BUT also increases peripheral insulin sensitivity, reducing delivery of free fatty acids and glucose to the liver. Exercise also increases intrahepatic fatty acid oxidation, decreasing fatty acid synthesis, and helps prevent mitochondrial  and hepatocellular damage. Medications that can help reduce hepatic fat include metformin and GLP1RAs.   NAFLD Fibrosis score: -0.16  NAFLD FIBROSIS SCORE  Depending on score and local prevalence of advanced fibrosis, the score can be used to reliably predict (with high 80-low 90% accuracy) which patients are unlikely to have cellular evidence of fibrosis on biopsy.  NAFLD Score and Correlated Fibrosis Severity < -1.455 = F0-F2 -1.455 - 0.675  = Indeterminant score > 0.675 = F3-F4  Fibrosis Severity Scale  F0 = no fibrosis F1 = mild fibrosis F2 = moderate fibrosis F3 = severe fibrosis F4 = cirrhosis   4. Mixed hyperlipidemia Cardiovascular risk and specific lipid/LDL goals reviewed.  We discussed several lifestyle modifications today and Alexandra Fitzpatrick will continue to work on diet, exercise and weight loss efforts. Orders and follow up as documented in patient record.   5. At risk for heart disease Alexandra Fitzpatrick was given approximately 15 minutes of coronary artery disease prevention counseling today. She is 59 y.o. female and has risk factors for heart disease including obesity, HLD, and preDM. We discussed intensive lifestyle modifications today with an emphasis on specific weight loss instructions and strategies.   Repetitive spaced learning was employed today to elicit superior memory formation and behavioral change.  6. Class 1 obesity with serious comorbidity and body mass index (BMI) of 32.0 to 32.9 in adult, unspecified obesity type Alexandra Fitzpatrick is currently in the action stage of change. As such, her goal is to continue with weight loss efforts. She has agreed to keeping a food journal  and adhering to recommended goals of 1400-1500 calories and 95 grams of protein.   Exercise goals: For substantial health benefits, adults should do at least 150 minutes (2 hours and 30 minutes) a week of moderate-intensity, or 75 minutes (1 hour and 15 minutes) a week of vigorous-intensity aerobic physical activity,  or an equivalent combination of moderate- and vigorous-intensity aerobic activity. Aerobic activity should be performed in episodes of at least 10 minutes, and preferably, it should be spread throughout the week.  Behavioral modification strategies: increasing lean protein intake, decreasing simple carbohydrates, increasing vegetables and increasing water intake.  Alexandra Fitzpatrick has agreed to follow-up with our clinic in 2-3 weeks. She was informed of the importance of frequent follow-up visits to maximize her success with intensive lifestyle modifications for her multiple health conditions.   Objective:   Blood pressure 131/81, pulse 68, temperature 98.3 F (36.8 C), temperature source Oral, height 5\' 7"  (1.702 m), weight 208 lb (94.3 kg), SpO2 98 %. Body mass index is 32.58 kg/m.  General: Cooperative, alert, well developed, in no acute distress. HEENT: Conjunctivae and lids unremarkable. Cardiovascular: Regular rhythm.  Lungs: Normal work of breathing. Neurologic: No focal deficits.   Lab Results  Component Value Date   CREATININE 0.87 04/27/2019   BUN 10 04/27/2019   NA 138 04/27/2019   K 3.5 04/27/2019   CL 104 04/27/2019   CO2 22 04/27/2019   Lab Results  Component Value Date   ALT 33 04/27/2019   AST 32 04/27/2019   ALKPHOS 56 04/27/2019   BILITOT 0.8 04/27/2019   Lab Results  Component Value Date   HGBA1C 6.5 10/12/2018   HGBA1C 5.8 (A) 06/29/2018   Lab Results  Component Value Date   CHOL 199 10/12/2018   HDL 38.90 (L) 10/12/2018   LDLDIRECT 130.0 10/12/2018   TRIG 299.0 (H) 10/12/2018   CHOLHDL 5 10/12/2018   Lab Results  Component Value Date   WBC 15.3 (H) 04/27/2019   HGB 14.1 04/27/2019   HCT 42.4 04/27/2019   MCV 91.8 04/27/2019   PLT 239 04/27/2019   Attestation Statements:   Reviewed by clinician on day of visit: allergies, medications, problem list, medical history, surgical history, family history, social history, and previous encounter notes.  I,  Water quality scientist, CMA, am acting as transcriptionist for Briscoe Deutscher, DO  I have reviewed the above documentation for accuracy and completeness, and I agree with the above. Briscoe Deutscher, DO

## 2020-05-04 ENCOUNTER — Ambulatory Visit: Payer: 59 | Admitting: Dermatology

## 2020-05-11 ENCOUNTER — Other Ambulatory Visit: Payer: Self-pay

## 2020-05-11 ENCOUNTER — Encounter: Payer: Self-pay | Admitting: Obstetrics and Gynecology

## 2020-05-11 ENCOUNTER — Ambulatory Visit: Payer: 59 | Admitting: Obstetrics and Gynecology

## 2020-05-11 VITALS — BP 126/64 | HR 80 | Ht 67.0 in | Wt 213.0 lb

## 2020-05-11 DIAGNOSIS — Z01419 Encounter for gynecological examination (general) (routine) without abnormal findings: Secondary | ICD-10-CM

## 2020-05-11 DIAGNOSIS — N760 Acute vaginitis: Secondary | ICD-10-CM

## 2020-05-11 MED ORDER — BETAMETHASONE VALERATE 0.1 % EX OINT: 1.0000 "application " | TOPICAL_OINTMENT | Freq: Two times a day (BID) | CUTANEOUS | 0 refills | Status: DC

## 2020-05-11 NOTE — Patient Instructions (Signed)

## 2020-05-11 NOTE — Progress Notes (Signed)
59 y.o. G40P2002 Married White or Caucasian Not Hispanic or Latino female here for annual exam. Patient has vaginal itching.  Was diagnoses with Breast cancer in her right breast in 2017. S/P lumpectomy and radiation.  On Tamoxifen since 2017.  Vasomotor symptoms are tolerable. No vaginal bleeding.  She does have some vaginal itching for the last few weeks, no odor, no discharge.  Sexually active without penetration. If they do have intercourse uses lubrication which helps.  Always feels she has to void (for a long time). Small amounts, sometimes needs to push to empty.  Some urgency to void. No leakage.  She has some constipation, colonoscopy next month.   She travels to [place] for work, see's her Oncologist when she is down there.     Working on weight loss with Dr Juleen China, has lost 6-7 lbs since July.   Patient's last menstrual period was 10/11/2016.          Sexually active: Yes.    The current method of family planning is post menopausal status.    Exercising: Yes.    Walking  Smoker:  no  Health Maintenance: Pap:  10/16/18 WNL HR HPV Neg  History of abnormal Pap:  no MMG:  02/29/20 Bi-rads 2 benign  BMD:   02/29/20 normal  Colonoscopy: 10/2015 has follow up in Oct  TDaP:  02/18/17 Gardasil: NA   reports that she has never smoked. She has never used smokeless tobacco. She reports previous alcohol use. She reports that she does not use drugs. No ETOH. 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 Originally from here, recently moved back here. Son is 51, lives [place], in [place]. Daughter is 68 lives at home, has multiple health issues.   Past Medical History:  Diagnosis Date  . Abnormal cholesterol test   . Allergies   . Amenorrhea   . Anxiety   . B12 deficiency   . Back pain   . Breast cancer ([place]) 2017   s/p surgery and radiation  . Constipation   . Depression   . Dysmenorrhea   . Fatty liver   . High blood pressure   . High blood sugar   . High  triglycerides   . Migraines   . Pre-diabetes   . Scheuermann's disease   . Sleep apnea    pt stated she wa snever dx  . Vitamin D deficiency     Past Surgical History:  Procedure Laterality Date  . BREAST LUMPECTOMY Right 2017  . GUM SURGERY     alloderm inplantation  . WISDOM TOOTH EXTRACTION      Current Outpatient Medications  Medication Sig Dispense Refill  . aspirin EC 81 MG tablet Take 81 mg by mouth daily.    . carboxymethylcellulose (REFRESH PLUS) 0.5 % SOLN 1 drop 3 (three) times daily as needed.    . Cholecalciferol 25 MCG (1000 UT) tablet Take 1,000 Units by mouth daily.     Marland Kitchen ketoconazole (NIZORAL) 2 % shampoo Apply topically.    Marland Kitchen loratadine (CLARITIN) 10 MG tablet Take 10 mg by mouth daily.    . Magnesium Citrate 100 MG TABS Take by mouth.    . Multiple Vitamins-Minerals (CENTRUM ADULTS PO) Take by mouth.    . SUMAtriptan (IMITREX) 50 MG tablet TAKE 1 TABLET BY MOUTH AS NEEDED FOR MIGRAINE  2  . tamoxifen (NOLVADEX) 20 MG tablet Take 20 mg by mouth daily.    Marland Kitchen venlafaxine XR (EFFEXOR-XR) 75 MG 24  hr capsule Take 1 capsule (75 mg total) by mouth daily with breakfast. 90 capsule 2  . vitamin B-12 (CYANOCOBALAMIN) 1000 MCG tablet Take 1,000 mcg by mouth daily.     No current facility-administered medications for this visit.    Family History  Problem Relation Age of Onset  . Cancer Mother        mouth  . Early death Mother   . Arthritis Father   . Hypertension Father   . Sleep apnea Father   . Depression Sister   . Breast cancer Sister   . Arthritis Maternal Grandmother   . Cancer Maternal Grandmother        septocemia  . Arthritis Maternal Grandfather   . COPD Maternal Grandfather   . Lung cancer Maternal Grandfather        smoker  . Arthritis Paternal Grandmother   . Colon cancer Paternal Grandmother        97's  . Alzheimer's disease Paternal Grandmother        ?  . Diabetes Paternal Grandmother   . Arthritis Paternal Grandfather   . Lung cancer  Paternal Grandfather        abspetosis  . Other Daughter        erythromelalgia    Review of Systems  Exam:   BP 126/64   Pulse 80   Ht 5\' 7"  (1.702 m)   Wt 213 lb (96.6 kg)   LMP 10/11/2016   SpO2 97%   BMI 33.36 kg/m   Weight change: @WEIGHTCHANGE @ Height:   Height: 5\' 7"  (170.2 cm)  Ht Readings from Last 3 Encounters:  05/11/20 5\' 7"  (1.702 m)  04/24/20 5\' 7"  (1.702 m)  04/12/20 5\' 7"  (1.702 m)    General appearance: alert, cooperative and appears stated age Head: Normocephalic, without obvious abnormality, atraumatic Neck: no adenopathy, supple, symmetrical, trachea midline and thyroid normal to inspection and palpation Lungs: clear to auscultation bilaterally Cardiovascular: regular rate and rhythm Breasts: normal appearance, no masses or tenderness, evidence of right lumpectomy Abdomen: soft, non-tender; non distended,  no masses,  no organomegaly Extremities: extremities normal, atraumatic, no cyanosis or edema Skin: Skin color, texture, turgor normal. No rashes or lesions Lymph nodes: Cervical, supraclavicular, and axillary nodes normal. No abnormal inguinal nodes palpated Neurologic: Grossly normal   Pelvic: External genitalia:  no lesions, mild erythema              Urethra:  normal appearing urethra with no masses, tenderness or lesions              Bartholins and Skenes: normal                 Vagina: mildly atrophic appearing vagina with slight erythema and a slight increase in watery, white vaginal d/c              Cervix: no lesions               Bimanual Exam:  Uterus:  normal size, contour, position, consistency, mobility, non-tender              Adnexa: no mass, fullness, tenderness               Rectovaginal: Confirms               Anus:  normal sphincter tone, no lesions  Terence Lux chaperoned for the exam.  A:  Well Woman with normal exam  Prediabetes, HTN, elevated LFT. Working with Dr Juleen China in the  weight loss clinic.    Vulvovaginitis  P:   Pap is UTD  Mammogram UTD  Colonoscopy scheduled  Discussed breast self exam  Discussed calcium and vit D intake  Nuswab vaginitis panel sent  Vulvar skin care information given  Treat with steroid ointment

## 2020-05-16 ENCOUNTER — Other Ambulatory Visit: Payer: Self-pay

## 2020-05-16 DIAGNOSIS — B3731 Acute candidiasis of vulva and vagina: Secondary | ICD-10-CM

## 2020-05-16 LAB — NUSWAB VAGINITIS (VG)
Candida albicans, NAA: POSITIVE — AB
Candida glabrata, NAA: NEGATIVE
Trich vag by NAA: NEGATIVE

## 2020-05-16 MED ORDER — FLUCONAZOLE 150 MG PO TABS: 150.0000 mg | ORAL_TABLET | Freq: Once | ORAL | 0 refills | Status: AC

## 2020-05-16 NOTE — Progress Notes (Signed)
Diflucan sent to pharmacy on file. Patient notified.

## 2020-05-17 ENCOUNTER — Other Ambulatory Visit: Payer: Self-pay

## 2020-05-17 ENCOUNTER — Encounter (INDEPENDENT_AMBULATORY_CARE_PROVIDER_SITE_OTHER): Payer: Self-pay | Admitting: Family Medicine

## 2020-05-17 ENCOUNTER — Ambulatory Visit (INDEPENDENT_AMBULATORY_CARE_PROVIDER_SITE_OTHER): Payer: 59 | Admitting: Family Medicine

## 2020-05-17 VITALS — BP 135/78 | HR 64 | Temp 98.2°F | Ht 67.0 in | Wt 208.0 lb

## 2020-05-17 DIAGNOSIS — R7303 Prediabetes: Secondary | ICD-10-CM

## 2020-05-17 DIAGNOSIS — Z9189 Other specified personal risk factors, not elsewhere classified: Secondary | ICD-10-CM

## 2020-05-17 DIAGNOSIS — E8881 Metabolic syndrome: Secondary | ICD-10-CM | POA: Diagnosis not present

## 2020-05-17 DIAGNOSIS — E782 Mixed hyperlipidemia: Secondary | ICD-10-CM

## 2020-05-17 DIAGNOSIS — Z6832 Body mass index (BMI) 32.0-32.9, adult: Secondary | ICD-10-CM

## 2020-05-17 DIAGNOSIS — E669 Obesity, unspecified: Secondary | ICD-10-CM

## 2020-05-21 NOTE — Progress Notes (Signed)
Chief Complaint:   OBESITY Alexandra Fitzpatrick is here to discuss her progress with her obesity treatment plan along with follow-up of her obesity related diagnoses. Alexandra Fitzpatrick is on the Category 3 Plan and states she is following her eating plan approximately 80% of the time. Alexandra Fitzpatrick states she is walking 45 minutes 3 times per week.  Total lbs lost since last in-office visit: 5  Interim History: Alexandra Fitzpatrick's son visited since our last visit. She enjoyed time with him, made family favorites In terms of meals. Gained weight during that time, then got back on the plan, so has been losing again. She tried and likes Actor protein drinks, chocolate is her favorite.   Assessment/Plan:   1. Prediabetes Goal is HgbA1c < 5.7 and insulin level closer to 5. Last A1c on 02/28/20 was 6.1, see CareEverywhere. The current medical regimen is effective;  continue present plan and medications.  2. Mixed hyperlipidemia Last labs: HDL 45, LDL 106, triglycerides 205. Consistent with metabolic syndrome related dyslipidemia. The current medical regimen is effective;  continue present plan and medications.  3. Metabolic syndrome Risk factors (3 or more constitute Metabolic Syndrome): waist > 35" for female triglycerides > 150 impaired fasting blood sugar. Goal: Lose 7-10% of starting weight. Counseling: Intensive lifestyle modifications are the first line treatment for this issue. We discussed several lifestyle modifications today and she will continue to work on diet, exercise and weight loss efforts.   4. At risk for heart disease Alexandra Fitzpatrick was given approximately 15 minutes of coronary artery disease prevention counseling today. She is 59 y.o. female and has risk factors for heart disease including obesity. We discussed intensive lifestyle modifications today with an emphasis on specific weight loss instructions and strategies.   During insulin resistance, several metabolic alterations induce the development of  cardiovascular disease. For instance, insulin resistance can induce an imbalance in glucose metabolism that generates chronic hyperglycemia, which in turn triggers oxidative stress and causes an inflammatory response that leads to cell damage. Insulin resistance can also alter systemic lipid metabolism which then leads to the development of dyslipidemia and the well-known lipid triad: (1) high levels of plasma triglycerides, (2) low levels of high-density lipoprotein, and (3) the appearance of small dense low-density lipoproteins. This triad, along with endothelial dysfunction, which can also be induced by aberrant insulin signaling, contribute to atherosclerotic plaque formation.   5. Class 1 obesity with serious comorbidity and body mass index (BMI) of 32.0 to 32.9 in adult, unspecified obesity type  Alexandra Fitzpatrick is currently in the action stage of change. As such, her goal is to continue with weight loss efforts. She has agreed to the Category 3 Plan.   Exercise goals: For substantial health benefits, adults should do at least 150 minutes (2 hours and 30 minutes) a week of moderate-intensity, or 75 minutes (1 hour and 15 minutes) a week of vigorous-intensity aerobic physical activity, or an equivalent combination of moderate- and vigorous-intensity aerobic activity. Aerobic activity should be performed in episodes of at least 10 minutes, and preferably, it should be spread throughout the week. Adults should also include muscle-strengthening activities that involve all major muscle groups on 2 or more days a week.  Behavioral modification strategies: increasing lean protein intake.  Alexandra Fitzpatrick has agreed to follow-up with our clinic in 3 weeks. She was informed of the importance of frequent follow-up visits to maximize her success with intensive lifestyle modifications for her multiple health conditions.   Objective:   Blood pressure 135/78, pulse 64,  temperature 98.2 F (36.8 C), temperature source Oral,  height 5\' 7"  (1.702 m), weight 208 lb (94.3 kg), SpO2 97 %. Body mass index is 32.58 kg/m.  General: Cooperative, alert, well developed, in no acute distress. HEENT: Conjunctivae and lids unremarkable. Cardiovascular: Regular rhythm.  Lungs: Normal work of breathing. Neurologic: No focal deficits.   Lab Results  Component Value Date   CREATININE 0.87 04/27/2019   BUN 10 04/27/2019   NA 138 04/27/2019   K 3.5 04/27/2019   CL 104 04/27/2019   CO2 22 04/27/2019   Lab Results  Component Value Date   ALT 33 04/27/2019   AST 32 04/27/2019   ALKPHOS 56 04/27/2019   BILITOT 0.8 04/27/2019   Lab Results  Component Value Date   HGBA1C 6.5 10/12/2018   HGBA1C 5.8 (A) 06/29/2018   No results found for: INSULIN No results found for: TSH Lab Results  Component Value Date   CHOL 199 10/12/2018   HDL 38.90 (L) 10/12/2018   LDLDIRECT 130.0 10/12/2018   TRIG 299.0 (H) 10/12/2018   CHOLHDL 5 10/12/2018   Lab Results  Component Value Date   WBC 15.3 (H) 04/27/2019   HGB 14.1 04/27/2019   HCT 42.4 04/27/2019   MCV 91.8 04/27/2019   PLT 239 04/27/2019   Attestation Statements:   Reviewed by clinician on day of visit: allergies, medications, problem list, medical history, surgical history, family history, social history, and previous encounter notes.

## 2020-05-22 ENCOUNTER — Encounter (INDEPENDENT_AMBULATORY_CARE_PROVIDER_SITE_OTHER): Payer: Self-pay | Admitting: Family Medicine

## 2020-06-09 ENCOUNTER — Ambulatory Visit: Payer: 59 | Admitting: Physician Assistant

## 2020-06-14 ENCOUNTER — Encounter (INDEPENDENT_AMBULATORY_CARE_PROVIDER_SITE_OTHER): Payer: Self-pay | Admitting: Family Medicine

## 2020-06-14 ENCOUNTER — Other Ambulatory Visit: Payer: Self-pay

## 2020-06-14 ENCOUNTER — Ambulatory Visit (INDEPENDENT_AMBULATORY_CARE_PROVIDER_SITE_OTHER): Payer: 59 | Admitting: Family Medicine

## 2020-06-14 VITALS — BP 135/78 | HR 73 | Temp 98.3°F | Ht 67.0 in | Wt 208.0 lb

## 2020-06-14 DIAGNOSIS — R7303 Prediabetes: Secondary | ICD-10-CM

## 2020-06-14 DIAGNOSIS — K760 Fatty (change of) liver, not elsewhere classified: Secondary | ICD-10-CM | POA: Diagnosis not present

## 2020-06-14 DIAGNOSIS — E669 Obesity, unspecified: Secondary | ICD-10-CM

## 2020-06-14 DIAGNOSIS — E8881 Metabolic syndrome: Secondary | ICD-10-CM | POA: Diagnosis not present

## 2020-06-14 DIAGNOSIS — Z6832 Body mass index (BMI) 32.0-32.9, adult: Secondary | ICD-10-CM

## 2020-06-15 ENCOUNTER — Ambulatory Visit (AMBULATORY_SURGERY_CENTER): Payer: 59 | Admitting: Gastroenterology

## 2020-06-15 ENCOUNTER — Encounter: Payer: Self-pay | Admitting: Gastroenterology

## 2020-06-15 VITALS — BP 152/63 | HR 52 | Temp 97.4°F | Resp 16 | Ht 67.0 in | Wt 213.0 lb

## 2020-06-15 DIAGNOSIS — R9389 Abnormal findings on diagnostic imaging of other specified body structures: Secondary | ICD-10-CM

## 2020-06-15 DIAGNOSIS — K5730 Diverticulosis of large intestine without perforation or abscess without bleeding: Secondary | ICD-10-CM | POA: Diagnosis not present

## 2020-06-15 DIAGNOSIS — K649 Unspecified hemorrhoids: Secondary | ICD-10-CM

## 2020-06-15 MED ORDER — SODIUM CHLORIDE 0.9 % IV SOLN: 500.0000 mL | Freq: Once | INTRAVENOUS | Status: DC

## 2020-06-15 NOTE — Op Note (Addendum)
[facility] Patient Name: Alexandra Fitzpatrick Procedure Date: 06/15/2020 8:36 AM MRN: [id number] Endoscopist: Mauri Pole , MD Age: 59 Referring MD:  Date of Birth: 11/10/1960 Gender: Female Account #: [id number] Procedure:                Colonoscopy Indications:              Abnormal CT of the GI tract Medicines:                Monitored Anesthesia Care Procedure:                Pre-Anesthesia Assessment:                           - Prior to the procedure, a History and Physical                            was performed, and patient medications and                            allergies were reviewed. The patient's tolerance of                            previous anesthesia was also reviewed. The risks                            and benefits of the procedure and the sedation                            options and risks were discussed with the patient.                            All questions were answered, and informed consent                            was obtained. Prior Anticoagulants: The patient has                            taken no previous anticoagulant or antiplatelet                            agents. ASA Grade Assessment: II - A patient with                            mild systemic disease. After reviewing the risks                            and benefits, the patient was deemed in                            satisfactory condition to undergo the procedure.                           After obtaining informed consent, the colonoscope  was passed under direct vision. Throughout the                            procedure, the patient's blood pressure, pulse, and                            oxygen saturations were monitored continuously. The                            Colonoscope was introduced through the anus and                            advanced to the the cecum, identified by                            appendiceal orifice and ileocecal  valve. The                            colonoscopy was performed without difficulty. The                            patient tolerated the procedure well. The quality                            of the bowel preparation was excellent. The                            ileocecal valve, appendiceal orifice, and rectum                            were photographed. Scope In: 8:50:42 AM Scope Out: 9:11:32 AM Scope Withdrawal Time: 0 hours 10 minutes 51 seconds  Total Procedure Duration: 0 hours 20 minutes 50 seconds  Findings:                 The perianal and digital rectal examinations were                            normal.                           A patchy area of mildly erythematous mucosa was                            found in the ascending colon and in the cecum.                            Biopsies were taken with a cold forceps for                            histology.                           A few small-mouthed diverticula were found in the  sigmoid colon and descending colon.                           Non-bleeding internal hemorrhoids were found during                            retroflexion. The hemorrhoids were small.                           The exam was otherwise without abnormality. Complications:            No immediate complications. Estimated Blood Loss:     Estimated blood loss was minimal. Impression:               - Erythematous mucosa in the ascending colon and in                            the cecum. Biopsied.                           - Diverticulosis in the sigmoid colon and in the                            descending colon.                           - Non-bleeding internal hemorrhoids.                           - The examination was otherwise normal. Recommendation:           - Patient has a contact number available for                            emergencies. The signs and symptoms of potential                            delayed  complications were discussed with the                            patient. Return to normal activities tomorrow.                            Written discharge instructions were provided to the                            patient.                           - Resume previous diet.                           - Continue present medications.                           - Await pathology results.                           -  Repeat colonoscopy in 10 years for screening                            purposes. Mauri Pole, MD 06/15/2020 9:24:34 AM This report has been signed electronically.

## 2020-06-15 NOTE — Progress Notes (Signed)
A/ox3, pleased with MAC, report to RN 

## 2020-06-15 NOTE — Progress Notes (Signed)
Vital signs checked by:CW ? ?The medical and surgical history was reviewed and verified with the patient. ? ?

## 2020-06-15 NOTE — Progress Notes (Signed)
Chief Complaint:   OBESITY Alexandra Fitzpatrick is here to discuss her progress with her obesity treatment plan along with follow-up of her obesity related diagnoses. Alexandra Fitzpatrick is on the Category 3 Plan and states she is following her eating plan approximately 70% of the time. Alexandra Fitzpatrick states she is walking for 30 minutes 2 times per week and taking classes for 45 minutes 3 times per week.  Today's visit was #: 6 Starting weight: 215 lbs Starting date: 03/09/2020 Today's weight: 208 lbs Today's date: 06/14/2020 Total lbs lost to date: 7 lbs Total lbs lost since last in-office visit: 0 Total weight loss percentage to date: -3.26%  Interim History: Alexandra Fitzpatrick's non-scale victories: Her clothes fit better.  Her husband says she "feels smaller".  She has less burning in her legs and less joint pain when walking.  She says she recently ordered some clothes and they were too big when they arrived.  Assessment/Plan:   1. Metabolic syndrome Starting goal: Lose 7-10% of starting weight. She will continue to focus on protein-rich, low simple carbohydrate foods. We reviewed the importance of hydration, regular exercise for stress reduction, and restorative sleep.    2. Fatty liver Treatment goal: 7-10% reduction of body weight.  Aerobic and resistance physical activity are important to help achieve a healthy body weight BUT also increases peripheral insulin sensitivity, reducing delivery of free fatty acids and glucose to the liver. Exercise also increases intrahepatic fatty acid oxidation, decreasing fatty acid synthesis, and helps prevent mitochondrial and hepatocellular damage. Medications that can help reduce hepatic fat include metformin and GLP1RAs.   3. Prediabetes Not at goal. Goal is HgbA1c < 5.7 and insulin level closer to 5.    Lab Results  Component Value Date   HGBA1C 6.5 10/12/2018   4. Class 1 obesity with serious comorbidity and body mass index (BMI) of 32.0 to 32.9 in adult, unspecified obesity  type  Alexandra Fitzpatrick is currently in the action stage of change. As such, her goal is to continue with weight loss efforts. She has agreed to the Category 3 Plan.   Exercise goals: As is.  Behavioral modification strategies: increasing lean protein intake, decreasing simple carbohydrates and increasing vegetables.  Alexandra Fitzpatrick has agreed to follow-up with our clinic in 3 weeks. She was informed of the importance of frequent follow-up visits to maximize her success with intensive lifestyle modifications for her multiple health conditions.   Objective:   Blood pressure 135/78, pulse 73, temperature 98.3 F (36.8 C), temperature source Oral, height 5\' 7"  (1.702 m), weight 208 lb (94.3 kg), SpO2 95 %. Body mass index is 32.58 kg/m.  General: Cooperative, alert, well developed, in no acute distress. HEENT: Conjunctivae and lids unremarkable. Cardiovascular: Regular rhythm.  Lungs: Normal work of breathing. Neurologic: No focal deficits.   Lab Results  Component Value Date   CREATININE 0.87 04/27/2019   BUN 10 04/27/2019   NA 138 04/27/2019   K 3.5 04/27/2019   CL 104 04/27/2019   CO2 22 04/27/2019   Lab Results  Component Value Date   ALT 33 04/27/2019   AST 32 04/27/2019   ALKPHOS 56 04/27/2019   BILITOT 0.8 04/27/2019   Lab Results  Component Value Date   HGBA1C 6.5 10/12/2018   HGBA1C 5.8 (A) 06/29/2018   Lab Results  Component Value Date   CHOL 199 10/12/2018   HDL 38.90 (L) 10/12/2018   LDLDIRECT 130.0 10/12/2018   TRIG 299.0 (H) 10/12/2018   CHOLHDL 5 10/12/2018   Lab  Results  Component Value Date   WBC 15.3 (H) 04/27/2019   HGB 14.1 04/27/2019   HCT 42.4 04/27/2019   MCV 91.8 04/27/2019   PLT 239 04/27/2019   Attestation Statements:   Reviewed by clinician on day of visit: allergies, medications, problem list, medical history, surgical history, family history, social history, and previous encounter notes.  Time spent on visit including pre-visit chart review and  post-visit care and charting was 30 minutes.   I, Water quality scientist, CMA, am acting as transcriptionist for Briscoe Deutscher, DO  I have reviewed the above documentation for accuracy and completeness, and I agree with the above. Briscoe Deutscher, DO

## 2020-06-15 NOTE — Patient Instructions (Signed)
YOU HAD AN ENDOSCOPIC PROCEDURE TODAY AT THE [facility]:   Refer to the procedure report that was given to you for any specific questions about what was found during the examination.  If the procedure report does not answer your questions, please call your gastroenterologist to clarify.  If you requested that your care partner not be given the details of your procedure findings, then the procedure report has been included in a sealed envelope for you to review at your convenience later.  YOU SHOULD EXPECT: Some feelings of bloating in the abdomen. Passage of more gas than usual.  Walking can help get rid of the air that was put into your GI tract during the procedure and reduce the bloating. If you had a lower endoscopy (such as a colonoscopy or flexible sigmoidoscopy) you may notice spotting of blood in your stool or on the toilet paper. If you underwent a bowel prep for your procedure, you may not have a normal bowel movement for a few days.  Please Note:  You might notice some irritation and congestion in your nose or some drainage.  This is from the oxygen used during your procedure.  There is no need for concern and it should clear up in a day or so.  SYMPTOMS TO REPORT IMMEDIATELY:   Following lower endoscopy (colonoscopy or flexible sigmoidoscopy):  Excessive amounts of blood in the stool  Significant tenderness or worsening of abdominal pains  Swelling of the abdomen that is new, acute  Fever of 100F or higher  For urgent or emergent issues, a gastroenterologist can be reached at any hour by calling [phone]. Do not use MyChart messaging for urgent concerns.    DIET:  We do recommend a small meal at first, but then you may proceed to your regular diet.  Drink plenty of fluids but you should avoid alcoholic beverages for 24 hours.  ACTIVITY:  You should plan to take it easy for the rest of today and you should NOT DRIVE or use heavy machinery until tomorrow (because  of the sedation medicines used during the test).    FOLLOW UP: Our staff will call the number listed on your records 48-72 hours following your procedure to check on you and address any questions or concerns that you may have regarding the information given to you following your procedure. If we do not reach you, we will leave a message.  We will attempt to reach you two times.  During this call, we will ask if you have developed any symptoms of COVID 19. If you develop any symptoms (ie: fever, flu-like symptoms, shortness of breath, cough etc.) before then, please call [phone].  If you test positive for Covid 19 in the 2 weeks post procedure, please call and report this information to us.    If any biopsies were taken you will be contacted by phone or by letter within the next 1-3 weeks.  Please call us at [phone] if you have not heard about the biopsies in 3 weeks.    SIGNATURES/CONFIDENTIALITY: You and/or your care partner have signed paperwork which will be entered into your electronic medical record.  These signatures attest to the fact that that the information above on your After Visit Summary has been reviewed and is understood.  Full responsibility of the confidentiality of this discharge information lies with you and/or your care-partner. 

## 2020-06-15 NOTE — Progress Notes (Signed)
Called to room to assist during endoscopic procedure.  Patient ID and intended procedure confirmed with present staff. Received instructions for my participation in the procedure from the performing physician.  

## 2020-06-19 ENCOUNTER — Telehealth: Payer: Self-pay | Admitting: *Deleted

## 2020-06-19 NOTE — Telephone Encounter (Signed)
  Follow up Call-  [phone]   Patient questions:  Do you have a fever, pain , or abdominal swelling? No. Pain Score  0 *  Have you tolerated food without any problems? Yes.    Have you been able to return to your normal activities? Yes.    Do you have any questions about your discharge instructions: Diet   No. Medications  No. Follow up visit  No.  Do you have questions or concerns about your Care? No.  Actions: * If pain score is 4 or above: No action needed, pain <4.  1. Have you developed a fever since your procedure? no  2.   Have you had an respiratory symptoms (SOB or cough) since your procedure? no  3.   Have you tested positive for COVID 19 since your procedure no  4.   Have you had any family members/close contacts diagnosed with the COVID 19 since your procedure?  no   If yes to any of these questions please route to Joylene John, RN and Joella Prince, RN

## 2020-06-27 ENCOUNTER — Encounter: Payer: Self-pay | Admitting: Gastroenterology

## 2020-07-11 ENCOUNTER — Ambulatory Visit (INDEPENDENT_AMBULATORY_CARE_PROVIDER_SITE_OTHER): Payer: 59 | Admitting: Family Medicine

## 2020-07-11 ENCOUNTER — Encounter (INDEPENDENT_AMBULATORY_CARE_PROVIDER_SITE_OTHER): Payer: Self-pay | Admitting: Family Medicine

## 2020-07-11 ENCOUNTER — Other Ambulatory Visit: Payer: Self-pay

## 2020-07-11 VITALS — BP 122/74 | HR 62 | Temp 98.5°F | Ht 67.0 in | Wt 211.0 lb

## 2020-07-11 DIAGNOSIS — E669 Obesity, unspecified: Secondary | ICD-10-CM | POA: Diagnosis not present

## 2020-07-11 DIAGNOSIS — R7303 Prediabetes: Secondary | ICD-10-CM

## 2020-07-11 DIAGNOSIS — K760 Fatty (change of) liver, not elsewhere classified: Secondary | ICD-10-CM

## 2020-07-11 DIAGNOSIS — E782 Mixed hyperlipidemia: Secondary | ICD-10-CM

## 2020-07-11 DIAGNOSIS — Z6833 Body mass index (BMI) 33.0-33.9, adult: Secondary | ICD-10-CM

## 2020-07-11 DIAGNOSIS — Z9189 Other specified personal risk factors, not elsewhere classified: Secondary | ICD-10-CM | POA: Diagnosis not present

## 2020-07-11 NOTE — Progress Notes (Signed)
Chief Complaint:   OBESITY Alexandra Fitzpatrick is here to discuss her progress with her obesity treatment plan along with follow-up of her obesity related diagnoses.   Today's visit was #: 7 Starting weight: 215 lbs Starting date: 03/09/2020 Today's weight: 211 lbs Today's date: 07/11/2020 Total lbs lost to date: 4 lbs Body mass index is 33.05 kg/m.  Total weight loss percentage to date: -1.86%  Interim History: Fama says she had a recent colonoscopy.  It showed erythematous mucosa in the ascending colon and in the cecum, which was biopsied, diverticulosis in the sigmoid colon and in the descending colon, non-bleeding internal hemorrhoids, and the examination was otherwise normal.  She is to follow-up in 10 years, per GI. On Saturday Morning, she will have her flu shot and Shingrix.    Nutrition Plan: the Category 3 Plan for 75% of the time.  Hunger is moderately controlled controlled. Cravings are moderately controlled controlled.  Activity: Walking/class for 30-45 minutes for 3-4 times per week. Sleep: Sleep is restful.  Stress: She says she struggles with follow-through.  Assessment/Plan:   1. Fatty liver Treatment goal: 7-10% reduction of body weight.  Aerobic and resistance physical activity are important to help achieve a healthy body weight BUT also increases peripheral insulin sensitivity, reducing delivery of free fatty acids and glucose to the liver. Exercise also increases intrahepatic fatty acid oxidation, decreasing fatty acid synthesis, and helps prevent mitochondrial and hepatocellular damage. Medications that can help reduce hepatic fat include metformin and GLP1RAs. Plan: She will continue to focus on protein-rich, low simple carbohydrate foods.   2. Prediabetes Not at goal. Goal is HgbA1c < 5.7 and insulin level closer to 5. She will continue to focus on protein-rich, low simple carbohydrate foods. We reviewed the importance of hydration, regular exercise for stress  reduction, and restorative sleep.   3. Mixed hyperlipidemia Plan: Dietary changes: Increase soluble fiber. Decrease simple carbohydrates. Exercise changes: An average 40 minutes of moderate to vigorous-intensity aerobic activity 3 or 4 times per week. Lipid-lowering medications: None.   Lab Results  Component Value Date   CHOL 199 10/12/2018   HDL 38.90 (L) 10/12/2018   LDLDIRECT 130.0 10/12/2018   TRIG 299.0 (H) 10/12/2018   CHOLHDL 5 10/12/2018   Lab Results  Component Value Date   ALT 33 04/27/2019   AST 32 04/27/2019   ALKPHOS 56 04/27/2019   BILITOT 0.8 04/27/2019   4. At risk for heart disease Alexandra Fitzpatrick was given approximately 15 minutes of coronary artery disease prevention counseling today. She is 59 y.o. female and has risk factors for heart disease including obesity and preDM. We discussed intensive lifestyle modifications today with an emphasis on specific weight loss instructions and strategies. Repetitive spaced learning was employed today to elicit superior memory formation and behavioral change.  During insulin resistance, several metabolic alterations induce the development of cardiovascular disease. For instance, insulin resistance can induce an imbalance in glucose metabolism that generates chronic hyperglycemia, which in turn triggers oxidative stress and causes an inflammatory response that leads to cell damage. Insulin resistance can also alter systemic lipid metabolism which then leads to the development of dyslipidemia and the well-known lipid triad: (1) high levels of plasma triglycerides, (2) low levels of high-density lipoprotein, and (3) the appearance of small dense low-density lipoproteins. This triad, along with endothelial dysfunction, which can also be induced by aberrant insulin signaling, contribute to atherosclerotic plaque formation.   5. Class 1 obesity with serious comorbidity and body mass index (BMI)  of 33.0 to 33.9 in adult, unspecified obesity  type  Alexandra Fitzpatrick is currently in the action stage of change. As such, her goal is to continue with weight loss efforts.   Nutrition goals: She has agreed to the Category 3 Plan.   Exercise goals: For substantial health benefits, adults should do at least 150 minutes (2 hours and 30 minutes) a week of moderate-intensity, or 75 minutes (1 hour and 15 minutes) a week of vigorous-intensity aerobic physical activity, or an equivalent combination of moderate- and vigorous-intensity aerobic activity. Aerobic activity should be performed in episodes of at least 10 minutes, and preferably, it should be spread throughout the week.  Behavioral modification strategies: increasing lean protein intake, decreasing simple carbohydrates, increasing vegetables and increasing water intake.  We discussed barriers to cooking such as getting a slow cooker, for example.  Bette has agreed to follow-up with our clinic in 4 weeks. She was informed of the importance of frequent follow-up visits to maximize her success with intensive lifestyle modifications for her multiple health conditions.   Objective:   Blood pressure 122/74, pulse 62, temperature 98.5 F (36.9 C), temperature source Oral, height 5\' 7"  (1.702 m), weight 211 lb (95.7 kg), SpO2 98 %. Body mass index is 33.05 kg/m.  General: Cooperative, alert, well developed, in no acute distress. HEENT: Conjunctivae and lids unremarkable. Cardiovascular: Regular rhythm.  Lungs: Normal work of breathing. Neurologic: No focal deficits.   Lab Results  Component Value Date   CREATININE 0.87 04/27/2019   BUN 10 04/27/2019   NA 138 04/27/2019   K 3.5 04/27/2019   CL 104 04/27/2019   CO2 22 04/27/2019   Lab Results  Component Value Date   ALT 33 04/27/2019   AST 32 04/27/2019   ALKPHOS 56 04/27/2019   BILITOT 0.8 04/27/2019   Lab Results  Component Value Date   HGBA1C 6.5 10/12/2018   HGBA1C 5.8 (A) 06/29/2018   Lab Results  Component Value Date    CHOL 199 10/12/2018   HDL 38.90 (L) 10/12/2018   LDLDIRECT 130.0 10/12/2018   TRIG 299.0 (H) 10/12/2018   CHOLHDL 5 10/12/2018   Lab Results  Component Value Date   WBC 15.3 (H) 04/27/2019   HGB 14.1 04/27/2019   HCT 42.4 04/27/2019   MCV 91.8 04/27/2019   PLT 239 04/27/2019   Attestation Statements:   Reviewed by clinician on day of visit: allergies, medications, problem list, medical history, surgical history, family history, social history, and previous encounter notes.  I, Water quality scientist, CMA, am acting as transcriptionist for Briscoe Deutscher, DO  I have reviewed the above documentation for accuracy and completeness, and I agree with the above. Briscoe Deutscher, DO

## 2020-07-31 ENCOUNTER — Ambulatory Visit: Payer: 59 | Admitting: Dermatology

## 2020-08-15 ENCOUNTER — Other Ambulatory Visit: Payer: Self-pay

## 2020-08-15 ENCOUNTER — Ambulatory Visit (INDEPENDENT_AMBULATORY_CARE_PROVIDER_SITE_OTHER): Payer: 59 | Admitting: Family Medicine

## 2020-08-15 ENCOUNTER — Encounter (INDEPENDENT_AMBULATORY_CARE_PROVIDER_SITE_OTHER): Payer: Self-pay | Admitting: Family Medicine

## 2020-08-15 VITALS — BP 128/78 | HR 62 | Temp 98.1°F | Ht 67.0 in | Wt 211.0 lb

## 2020-08-15 DIAGNOSIS — Z9189 Other specified personal risk factors, not elsewhere classified: Secondary | ICD-10-CM

## 2020-08-15 DIAGNOSIS — K760 Fatty (change of) liver, not elsewhere classified: Secondary | ICD-10-CM

## 2020-08-15 DIAGNOSIS — E669 Obesity, unspecified: Secondary | ICD-10-CM | POA: Diagnosis not present

## 2020-08-15 DIAGNOSIS — E782 Mixed hyperlipidemia: Secondary | ICD-10-CM | POA: Diagnosis not present

## 2020-08-15 DIAGNOSIS — R7303 Prediabetes: Secondary | ICD-10-CM | POA: Diagnosis not present

## 2020-08-15 DIAGNOSIS — Z6833 Body mass index (BMI) 33.0-33.9, adult: Secondary | ICD-10-CM

## 2020-08-15 MED ORDER — INSULIN PEN NEEDLE 32G X 4 MM MISC: 1.0000 | Freq: Every day | 0 refills | Status: DC

## 2020-08-15 MED ORDER — SAXENDA 18 MG/3ML ~~LOC~~ SOPN: 3.0000 mg | PEN_INJECTOR | Freq: Every day | SUBCUTANEOUS | 0 refills | Status: DC

## 2020-08-16 NOTE — Progress Notes (Signed)
Chief Complaint:   OBESITY Alexandra Fitzpatrick is here to discuss her progress with her obesity treatment plan along with follow-up of her obesity related diagnoses.   Today's visit was #: 8 Starting weight: 215 lbs Starting date: 03/09/2020 Today's weight: 211 lbs Today's date: 08/15/2020 Total lbs lost to date: 4 lbs Body mass index is 33.05 kg/m.  Total weight loss percentage to date: -1.86%  Interim History: Alexandra Fitzpatrick had some recent labs that can be reviewed in [place].  A1c is 6.2. Nutrition Plan: the Category 3 Plan for 70-75% of the time.  Activity: Alexandra Fitzpatrick has increased the amount of walking she is doing.  Assessment/Plan:   1. Mixed hyperlipidemia Lipid-lowering medications: None.   Plan: Dietary changes: Increase soluble fiber. Decrease simple carbohydrates. Exercise changes: An average 40 minutes of moderate to vigorous-intensity aerobic activity 3 or 4 times per week.   Lab Results  Component Value Date   CHOL 199 10/12/2018   HDL 38.90 (L) 10/12/2018   LDLDIRECT 130.0 10/12/2018   TRIG 299.0 (H) 10/12/2018   CHOLHDL 5 10/12/2018   Lab Results  Component Value Date   ALT 33 04/27/2019   AST 32 04/27/2019   ALKPHOS 56 04/27/2019   BILITOT 0.8 04/27/2019   The 10-year ASCVD risk score Alexandra Bussing DC Jr., et al., 2013) is: 2.6%   Values used to calculate the score:     Age: 59 years     Sex: Female     Is Non-Hispanic African American: No     Diabetic: No     Tobacco smoker: No     Systolic Blood Pressure: 623 mmHg     Is BP treated: No     HDL Cholesterol: 45 mg/dL     Total Cholesterol: 183 mg/dL  2. Prediabetes Improving, but not optimized. Goal is HgbA1c < 5.7.  She will continue to focus on protein-rich, low simple carbohydrate foods. We reviewed the importance of hydration, regular exercise for stress reduction, and restorative sleep.   Lab Results  Component Value Date   HGBA1C 6.5 10/12/2018   - Start Liraglutide -Weight Management (SAXENDA) 18  MG/3ML SOPN; Inject 3 mg into the skin daily.  Dispense: 15 mL; Refill: 0 - Insulin Pen Needle 32G X 4 MM MISC; 1 each by Does not apply route daily.  Dispense: 100 each; Refill: 0  3. Fatty liver Treatment goal: 7-10% reduction of body weight.  Aerobic and resistance physical activity are important to help achieve a healthy body weight BUT also increases peripheral insulin sensitivity, reducing delivery of free fatty acids and glucose to the liver. Exercise also increases intrahepatic fatty acid oxidation, decreasing fatty acid synthesis, and helps prevent mitochondrial and hepatocellular damage. Medications that can help reduce hepatic fat include metformin and GLP1RAs.   4. At risk for nausea Alexandra Fitzpatrick was given approximately 10 minutes of nausea prevention counseling today. Alexandra Fitzpatrick is at risk for nausea due to her [place]. She was encouraged to titrate her medication slowly, make sure to stay hydrated, eat smaller portions throughout the day, and avoid high fat meals.   5. Class 1 obesity with serious comorbidity and body mass index (BMI) of 33.0 to 33.9 in adult, unspecified obesity type  Course: Alexandra Fitzpatrick is currently in the action stage of change. As such, her goal is to continue with weight loss efforts.   Nutrition goals: She has agreed to the Category 3 Plan.   Exercise goals: For substantial health benefits, adults should do at least  150 minutes (2 hours and 30 minutes) a week of moderate-intensity, or 75 minutes (1 hour and 15 minutes) a week of vigorous-intensity aerobic physical activity, or an equivalent combination of moderate- and vigorous-intensity aerobic activity. Aerobic activity should be performed in episodes of at least 10 minutes, and preferably, it should be spread throughout the week.  Behavioral modification strategies: increasing lean protein intake, decreasing simple carbohydrates, increasing vegetables, increasing water intake, dealing with family or coworker  sabotage, travel eating strategies and holiday eating strategies .  Alexandra Fitzpatrick has agreed to follow-up with our clinic in 4 weeks. She was informed of the importance of frequent follow-up visits to maximize her success with intensive lifestyle modifications for her multiple health conditions.   Objective:   Blood pressure 128/78, pulse 62, temperature 98.1 F (36.7 C), temperature source Oral, height 5\' 7"  (1.702 m), weight 211 lb (95.7 kg), SpO2 97 %. Body mass index is 33.05 kg/m.  General: Cooperative, alert, well developed, in no acute distress. HEENT: Conjunctivae and lids unremarkable. Cardiovascular: Regular rhythm.  Lungs: Normal work of breathing. Neurologic: No focal deficits.   Lab Results  Component Value Date   CREATININE 0.87 04/27/2019   BUN 10 04/27/2019   NA 138 04/27/2019   K 3.5 04/27/2019   CL 104 04/27/2019   CO2 22 04/27/2019   Lab Results  Component Value Date   ALT 33 04/27/2019   AST 32 04/27/2019   ALKPHOS 56 04/27/2019   BILITOT 0.8 04/27/2019   Lab Results  Component Value Date   HGBA1C 6.5 10/12/2018   HGBA1C 5.8 (A) 06/29/2018   Lab Results  Component Value Date   CHOL 199 10/12/2018   HDL 38.90 (L) 10/12/2018   LDLDIRECT 130.0 10/12/2018   TRIG 299.0 (H) 10/12/2018   CHOLHDL 5 10/12/2018   Lab Results  Component Value Date   WBC 15.3 (H) 04/27/2019   HGB 14.1 04/27/2019   HCT 42.4 04/27/2019   MCV 91.8 04/27/2019   PLT 239 04/27/2019   Attestation Statements:   Reviewed by clinician on day of visit: allergies, medications, problem list, medical history, surgical history, family history, social history, and previous encounter notes.  I, Water quality scientist, CMA, am acting as transcriptionist for Briscoe Deutscher, DO  I have reviewed the above documentation for accuracy and completeness, and I agree with the above. Briscoe Deutscher, DO

## 2020-08-17 ENCOUNTER — Telehealth (INDEPENDENT_AMBULATORY_CARE_PROVIDER_SITE_OTHER): Payer: Self-pay

## 2020-08-17 NOTE — Telephone Encounter (Signed)
PA has been initiated via [email or web address] for Saxenda.  Darrin Nipper (Key: Duard Brady) Rx #: [id number] Saxenda 18MG Fayne Mediate pen-injectors   Form [facility] Electronic Prior Authorization Form (2017 NCPDP) Created 1 hour ago Sent to Plan 6 minutes ago Plan Response 5 minutes ago Submit Clinical Questions less than a minute ago Determination Wait for Determination Please wait for [facility] 2017 NCPDP to return a determination.

## 2020-08-30 NOTE — Telephone Encounter (Signed)
PA denied due to med being a plan exclusion

## 2020-09-14 ENCOUNTER — Ambulatory Visit (INDEPENDENT_AMBULATORY_CARE_PROVIDER_SITE_OTHER): Payer: 59 | Admitting: Family Medicine

## 2020-09-14 ENCOUNTER — Other Ambulatory Visit: Payer: Self-pay

## 2020-09-14 ENCOUNTER — Encounter (INDEPENDENT_AMBULATORY_CARE_PROVIDER_SITE_OTHER): Payer: Self-pay | Admitting: Family Medicine

## 2020-09-14 VITALS — BP 112/70 | HR 59 | Temp 99.3°F | Ht 67.0 in | Wt 211.0 lb

## 2020-09-14 DIAGNOSIS — E559 Vitamin D deficiency, unspecified: Secondary | ICD-10-CM

## 2020-09-14 DIAGNOSIS — R7303 Prediabetes: Secondary | ICD-10-CM | POA: Diagnosis not present

## 2020-09-14 DIAGNOSIS — E669 Obesity, unspecified: Secondary | ICD-10-CM

## 2020-09-14 DIAGNOSIS — Z9189 Other specified personal risk factors, not elsewhere classified: Secondary | ICD-10-CM | POA: Diagnosis not present

## 2020-09-14 DIAGNOSIS — K760 Fatty (change of) liver, not elsewhere classified: Secondary | ICD-10-CM

## 2020-09-14 DIAGNOSIS — Z79810 Long term (current) use of selective estrogen receptor modulators (SERMs): Secondary | ICD-10-CM | POA: Diagnosis not present

## 2020-09-14 DIAGNOSIS — Z6833 Body mass index (BMI) 33.0-33.9, adult: Secondary | ICD-10-CM

## 2020-09-14 DIAGNOSIS — E782 Mixed hyperlipidemia: Secondary | ICD-10-CM

## 2020-09-20 NOTE — Progress Notes (Signed)
Chief Complaint:   OBESITY Alexandra Fitzpatrick is here to discuss her progress with her obesity treatment plan along with follow-up of her obesity related diagnoses.   Today's visit was #: 9 Starting weight: 215 lbs Starting date: 03/09/2020 Today's weight: 211 lbs Today's date: 09/14/2020 Total lbs lost to date: 4 lbs Body mass index is 33.05 kg/m.  Total weight loss percentage to date: -1.86%  Interim History: Alexandra Fitzpatrick says that her insurance does not cover Saxenda.  She would like to recommit to the meal plan before considering another medication. Nutrition Plan: the Category 2 Plan for 70% of the time.  Anti-obesity medications: None.  Activity: Walking for 30 minutes 2-3 times per week.  Assessment/Plan:   1. Prediabetes Reviewed recent labs. See Care Everywhere. She will continue to focus on protein-rich, low simple carbohydrate foods. We reviewed the importance of hydration, regular exercise for stress reduction, and restorative sleep.   Lab Results  Component Value Date   HGBA1C 6.5 10/12/2018   2. Fatty liver Visceral fat rating is 11.  LFTs are normal.  Alexandra Fitzpatrick had a CT on 04/27/2019 showing fatty liver. The current medical regimen is effective;  continue present plan and medications.  3. Use of tamoxifen (Nolvadex) History of breast cancer.  Followed by Oncology at the [facility].  Last visit was on 08/08/2020 - reviewed. We will continue to monitor symptoms as they relate to her weight loss journey.  4. Vitamin D deficiency Not at goal. Current vitamin D is 48, tested on 02/28/2020 (Care Everywhere). Optimal goal > 50 ng/dL.   Plan:  []   Continue Vitamin D @50 ,000 IU every week. [x]   Continue home supplement daily. [x]   Follow-up for routine testing of Vitamin D at least 2-3 times per year to avoid over-replacement.  5. Mixed hyperlipidemia Plan: Dietary changes: Increase soluble fiber. Decrease simple carbohydrates. Exercise changes: An average 40 minutes of  moderate to vigorous-intensity aerobic activity 3 or 4 times per week.   Lab Results  Component Value Date   CHOL 199 10/12/2018   HDL 38.90 (L) 10/12/2018   LDLDIRECT 130.0 10/12/2018   TRIG 299.0 (H) 10/12/2018   CHOLHDL 5 10/12/2018   Lab Results  Component Value Date   ALT 33 04/27/2019   AST 32 04/27/2019   ALKPHOS 56 04/27/2019   BILITOT 0.8 04/27/2019   The 10-year ASCVD risk score Mikey Bussing DC Jr., et al., 2013) is: 2%   Values used to calculate the score:     Age: 60 years     Sex: Female     Is Non-Hispanic African American: No     Diabetic: No     Tobacco smoker: No     Systolic Blood Pressure: 341 mmHg     Is BP treated: No     HDL Cholesterol: 45 mg/dL     Total Cholesterol: 183 mg/dL  6. At risk for diabetes mellitus Alexandra Fitzpatrick was given approximately 8 minutes of diabetes education and counseling today. We discussed intensive lifestyle modifications today with an emphasis on weight loss as well as increasing exercise and decreasing simple carbohydrates in her diet. We also reviewed medication options with an emphasis on risk versus benefit of those discussed.   7. Class 1 obesity with serious comorbidity and body mass index (BMI) of 33.0 to 33.9 in adult, unspecified obesity type  Course: Alexandra Fitzpatrick is currently in the action stage of change. As such, her goal is to continue with weight loss efforts.   Nutrition  goals: She has agreed to the Category 2 Plan.   Exercise goals: For substantial health benefits, adults should do at least 150 minutes (2 hours and 30 minutes) a week of moderate-intensity, or 75 minutes (1 hour and 15 minutes) a week of vigorous-intensity aerobic physical activity, or an equivalent combination of moderate- and vigorous-intensity aerobic activity. Aerobic activity should be performed in episodes of at least 10 minutes, and preferably, it should be spread throughout the week.  Behavioral modification strategies: increasing lean protein intake,  decreasing simple carbohydrates, increasing vegetables, increasing water intake, decreasing liquid calories and decreasing alcohol intake.  Alexandra Fitzpatrick has agreed to follow-up with our clinic in 4 weeks. She was informed of the importance of frequent follow-up visits to maximize her success with intensive lifestyle modifications for her multiple health conditions.   Objective:   Blood pressure 112/70, pulse (!) 59, temperature 99.3 F (37.4 C), temperature source Oral, height 5\' 7"  (1.702 m), weight 211 lb (95.7 kg), SpO2 97 %. Body mass index is 33.05 kg/m.  General: Cooperative, alert, well developed, in no acute distress. HEENT: Conjunctivae and lids unremarkable. Cardiovascular: Regular rhythm.  Lungs: Normal work of breathing. Neurologic: No focal deficits.   Lab Results  Component Value Date   CREATININE 0.87 04/27/2019   BUN 10 04/27/2019   NA 138 04/27/2019   K 3.5 04/27/2019   CL 104 04/27/2019   CO2 22 04/27/2019   Lab Results  Component Value Date   ALT 33 04/27/2019   AST 32 04/27/2019   ALKPHOS 56 04/27/2019   BILITOT 0.8 04/27/2019   Lab Results  Component Value Date   HGBA1C 6.5 10/12/2018   HGBA1C 5.8 (A) 06/29/2018   Lab Results  Component Value Date   CHOL 199 10/12/2018   HDL 38.90 (L) 10/12/2018   LDLDIRECT 130.0 10/12/2018   TRIG 299.0 (H) 10/12/2018   CHOLHDL 5 10/12/2018   Lab Results  Component Value Date   WBC 15.3 (H) 04/27/2019   HGB 14.1 04/27/2019   HCT 42.4 04/27/2019   MCV 91.8 04/27/2019   PLT 239 04/27/2019   Attestation Statements:   Reviewed by clinician on day of visit: allergies, medications, problem list, medical history, surgical history, family history, social history, and previous encounter notes.  I, Water quality scientist, CMA, am acting as transcriptionist for Briscoe Deutscher, DO  I have reviewed the above documentation for accuracy and completeness, and I agree with the above. Briscoe Deutscher, DO

## 2020-10-12 ENCOUNTER — Ambulatory Visit: Payer: 59 | Admitting: Physician Assistant

## 2020-10-12 ENCOUNTER — Other Ambulatory Visit: Payer: Self-pay

## 2020-10-12 ENCOUNTER — Encounter: Payer: Self-pay | Admitting: Physician Assistant

## 2020-10-12 DIAGNOSIS — L719 Rosacea, unspecified: Secondary | ICD-10-CM

## 2020-10-12 DIAGNOSIS — D485 Neoplasm of uncertain behavior of skin: Secondary | ICD-10-CM

## 2020-10-12 DIAGNOSIS — Z1283 Encounter for screening for malignant neoplasm of skin: Secondary | ICD-10-CM | POA: Diagnosis not present

## 2020-10-12 DIAGNOSIS — L820 Inflamed seborrheic keratosis: Secondary | ICD-10-CM | POA: Diagnosis not present

## 2020-10-12 MED ORDER — AMBULATORY NON FORMULARY MEDICATION: 1.0000 "application " | Freq: Every day | 5 refills | Status: DC

## 2020-10-19 ENCOUNTER — Ambulatory Visit (INDEPENDENT_AMBULATORY_CARE_PROVIDER_SITE_OTHER): Payer: 59 | Admitting: Family Medicine

## 2020-10-30 ENCOUNTER — Encounter: Payer: Self-pay | Admitting: Physician Assistant

## 2020-10-30 NOTE — Progress Notes (Signed)
   New Patient   Subjective  Alexandra Fitzpatrick is a 60 y.o. female who presents for the following: Annual Exam (Left cheek mole to possible bx, right arm).   The following portions of the chart were reviewed this encounter and updated as appropriate:  Tobacco  Allergies  Meds  Problems  Med Hx  Surg Hx  Fam Hx      Objective  Well appearing patient in no apparent distress; mood and affect are within normal limits.  A full examination was performed including scalp, head, eyes, ears, nose, lips, neck, chest, axillae, abdomen, back, buttocks, bilateral upper extremities, bilateral lower extremities, hands, feet, fingers, toes, fingernails, and toenails. All findings within normal limits unless otherwise noted below.  Objective  Chest - Medial [facility]): Full body skin examination  Objective  Left Zygomatic Area: Pink pearly papule     Objective  Left Malar Cheek: Centrifacial erythema with or without papules/pustules.   Objective  Right Forearm - Posterior: Erythematous stuck-on, waxy papule or plaque.    Assessment & Plan  Encounter for screening for malignant neoplasm of skin Chest - Medial (Center)  Yearly skin check  Neoplasm of uncertain behavior of skin Left Zygomatic Area  Skin / nail biopsy Type of biopsy: tangential   Informed consent: discussed and consent obtained   Timeout: patient name, date of birth, surgical site, and procedure verified   Procedure prep:  Patient was prepped and draped in usual sterile fashion (Non sterile) Prep type:  Chlorhexidine Anesthesia: the lesion was anesthetized in a standard fashion   Anesthetic:  1% lidocaine w/ epinephrine 1-100,000 local infiltration Instrument used: flexible razor blade   Outcome: patient tolerated procedure well   Post-procedure details: wound care instructions given    Specimen 1 - Surgical pathology Differential Diagnosis: bcc vs scc  Check Margins: No  Rosacea Left Malar  Cheek  Ordered Medications: AMBULATORY NON FORMULARY MEDICATION  Seborrheic keratosis, inflamed Right Forearm - Posterior  Destruction of lesion - Right Forearm - Posterior Complexity: simple   Destruction method: cryotherapy   Informed consent: discussed and consent obtained   Timeout:  patient name, date of birth, surgical site, and procedure verified Lesion destroyed using liquid nitrogen: Yes   Cryotherapy cycles:  3 Outcome: patient tolerated procedure well with no complications       I, Jaquari Reckner, PA-C, have reviewed all documentation for this visit. The documentation on 10/30/20 for the exam, diagnosis, procedures, and orders are all accurate and complete.Marland Kitchen

## 2020-11-16 ENCOUNTER — Ambulatory Visit (INDEPENDENT_AMBULATORY_CARE_PROVIDER_SITE_OTHER): Payer: 59 | Admitting: Family Medicine

## 2020-12-21 ENCOUNTER — Telehealth: Payer: Self-pay

## 2020-12-21 ENCOUNTER — Other Ambulatory Visit: Payer: Self-pay | Admitting: Physician Assistant

## 2020-12-21 NOTE — Telephone Encounter (Signed)
Phone call from patient stating that she received a bill for [employer] and she's not happy about it because she wasn't informed that the biopsy would be sent for pathology.  I asked patient if the Provider she seen told her that the biopsy was being sent out for pathology?  Patient states that the Provider did tell her that the biopsy would be sent out for pathology but the Provider didn't tell her who we use for pathology.  I asked patient if she asked the Provider who we use for pathology?  Patient states that she didn't ask who we use but the Provider should know who's in-network with the patient's insurance.  I informed patient that our Providers do not know what a patient's insurance company will cover when their seen. Patient then started yelling at me telling me that someone is going to pay this bill because she's not. I asked patient to please stop yelling at me because I'm not yelling at her.  Patient then lowered her tone and stated that if she knew the specimen was being sent off she would've waited and contacted her insurance company to see who was in network.  I asked patient if the Provider who did the biopsy asked her if she wanted the biopsy done on the day of her visit and if the Provider told her the biopsy would be sent off.  Patient stated that the Provider did explain all of this to her and she feels that we don't care and hung up the phone.

## 2020-12-25 ENCOUNTER — Ambulatory Visit (INDEPENDENT_AMBULATORY_CARE_PROVIDER_SITE_OTHER): Payer: 59 | Admitting: Family Medicine

## 2020-12-25 ENCOUNTER — Other Ambulatory Visit: Payer: Self-pay

## 2020-12-25 ENCOUNTER — Encounter (INDEPENDENT_AMBULATORY_CARE_PROVIDER_SITE_OTHER): Payer: Self-pay | Admitting: Family Medicine

## 2020-12-25 VITALS — BP 123/67 | HR 82 | Temp 98.8°F | Ht 67.0 in | Wt 214.0 lb

## 2020-12-25 DIAGNOSIS — E669 Obesity, unspecified: Secondary | ICD-10-CM

## 2020-12-25 DIAGNOSIS — R7303 Prediabetes: Secondary | ICD-10-CM | POA: Diagnosis not present

## 2020-12-25 DIAGNOSIS — K760 Fatty (change of) liver, not elsewhere classified: Secondary | ICD-10-CM | POA: Diagnosis not present

## 2020-12-25 DIAGNOSIS — E782 Mixed hyperlipidemia: Secondary | ICD-10-CM | POA: Diagnosis not present

## 2020-12-25 DIAGNOSIS — Z9189 Other specified personal risk factors, not elsewhere classified: Secondary | ICD-10-CM | POA: Diagnosis not present

## 2020-12-25 DIAGNOSIS — Z79810 Long term (current) use of selective estrogen receptor modulators (SERMs): Secondary | ICD-10-CM | POA: Diagnosis not present

## 2020-12-25 DIAGNOSIS — Z6833 Body mass index (BMI) 33.0-33.9, adult: Secondary | ICD-10-CM

## 2020-12-26 NOTE — Progress Notes (Signed)
Chief Complaint:   OBESITY Alexandra Fitzpatrick is here to discuss her progress with her obesity treatment plan along with follow-up of her obesity related diagnoses.   Today's visit was #: 10 Starting weight: 215 lbs Starting date: 03/09/2020 Today's weight: 214 lbs Today's date: 12/25/2020 Total lbs lost to date: 1 lb Body mass index is 33.52 kg/m.  Total weight loss percentage to date: -0.47%  Interim History:  Alexandra Fitzpatrick is doing Factor 66 and likes it better than Freshly.  She says there is more protein.  She tested positive for OSA and her CPAP is on backorder.  She is getting a dental appliance.  Anticipates ~2 weeks before getting it (this is her second week).  Current Meal Plan: the Category 2 Plan for 70-80% of the time.  Current Exercise Plan: Walking and going to exercise class on M, W, F for 30+ minutes 3-5 times per week.  Assessment/Plan:   1. Prediabetes Not at goal. Goal is HgbA1c < 5.7.  Medication: None.    Plan:  She will continue to focus on protein-rich, low simple carbohydrate foods. We reviewed the importance of hydration, regular exercise for stress reduction, and restorative sleep.   Lab Results  Component Value Date   HGBA1C 6.5 10/12/2018   2. Use of tamoxifen (Nolvadex) Will follow along as it relates to her weight loss journey.  3. Mixed hyperlipidemia Course: Not at goal. Lipid-lowering medications: None.   Plan: Dietary changes: Increase soluble fiber, decrease simple carbohydrates, decrease saturated fat. Exercise changes: Moderate to vigorous-intensity aerobic activity 150 minutes per week or as tolerated. We will continue to monitor along with PCP/specialists as it pertains to her weight loss journey.  Lab Results  Component Value Date   CHOL 199 10/12/2018   HDL 38.90 (L) 10/12/2018   LDLDIRECT 130.0 10/12/2018   TRIG 299.0 (H) 10/12/2018   CHOLHDL 5 10/12/2018   Lab Results  Component Value Date   ALT 33 04/27/2019   AST 32 04/27/2019    ALKPHOS 56 04/27/2019   BILITOT 0.8 04/27/2019   The 10-year ASCVD risk score Mikey Bussing DC Jr., et al., 2013) is: 2.7%   Values used to calculate the score:     Age: 60 years     Sex: Female     Is Non-Hispanic African American: No     Diabetic: No     Tobacco smoker: No     Systolic Blood Pressure: 250 mmHg     Is BP treated: No     HDL Cholesterol: 45 mg/dL     Total Cholesterol: 183 mg/dL  4. Fatty liver NAFLD is an umbrella term that encompasses a disease spectrum that includes steatosis (fat) without inflammation, steatohepatitis (NASH; fat + inflammation in a characteristic pattern), and cirrhosis. Bland steatosis is felt to be a benign condition, with extremely low to no risk of progression to cirrhosis, whereas NASH can progress to cirrhosis. The mainstay of treatment of NAFLD includes lifestyle modification to achieve weight loss, at least 7% of current body weight. Low carbohydrate diets can be beneficial in improving NAFLD liver histology. Additionally, exercise, even the absence of weight loss can have beneficial effects on the patient's metabolic profile and liver health.   5. At risk for heart disease Due to Alexandra Fitzpatrick's current state of health and medical condition(s), she is at a higher risk for heart disease.  This puts the patient at much greater risk to subsequently develop cardiopulmonary conditions that can significantly affect patient's quality of life  in a negative manner.    At least 8 minutes were spent on counseling Alexandra Fitzpatrick about these concerns today. Evidence-based interventions for health behavior change were utilized today including the discussion of self monitoring techniques, problem-solving barriers, and SMART goal setting techniques.  Specifically, regarding patient's less desirable eating habits and patterns, we employed the technique of small changes when Alexandra Fitzpatrick has not been able to fully commit to her prudent nutritional plan.  6. Obesity, current BMI 33.5  Course:  Alexandra Fitzpatrick is currently in the action stage of change. As such, her goal is to continue with weight loss efforts.   Nutrition goals: She has agreed to keeping a food journal and adhering to recommended goals of 1500 calories and 95 grams of protein.   Exercise goals: For substantial health benefits, adults should do at least 150 minutes (2 hours and 30 minutes) a week of moderate-intensity, or 75 minutes (1 hour and 15 minutes) a week of vigorous-intensity aerobic physical activity, or an equivalent combination of moderate- and vigorous-intensity aerobic activity. Aerobic activity should be performed in episodes of at least 10 minutes, and preferably, it should be spread throughout the week.  Behavioral modification strategies: increasing lean protein intake, decreasing simple carbohydrates, increasing vegetables and increasing water intake.  Alexandra Fitzpatrick has agreed to follow-up with our clinic in 4 weeks. She was informed of the importance of frequent follow-up visits to maximize her success with intensive lifestyle modifications for her multiple health conditions.   Objective:   Blood pressure 123/67, pulse 82, temperature 98.8 F (37.1 C), temperature source Oral, height 5\' 7"  (1.702 m), weight 214 lb (97.1 kg), SpO2 99 %. Body mass index is 33.52 kg/m.  General: Cooperative, alert, well developed, in no acute distress. HEENT: Conjunctivae and lids unremarkable. Cardiovascular: Regular rhythm.  Lungs: Normal work of breathing. Neurologic: No focal deficits.   Lab Results  Component Value Date   CREATININE 0.87 04/27/2019   BUN 10 04/27/2019   NA 138 04/27/2019   K 3.5 04/27/2019   CL 104 04/27/2019   CO2 22 04/27/2019   Lab Results  Component Value Date   ALT 33 04/27/2019   AST 32 04/27/2019   ALKPHOS 56 04/27/2019   BILITOT 0.8 04/27/2019   Lab Results  Component Value Date   HGBA1C 6.5 10/12/2018   HGBA1C 5.8 (A) 06/29/2018   Lab Results  Component Value Date   CHOL 199  10/12/2018   HDL 38.90 (L) 10/12/2018   LDLDIRECT 130.0 10/12/2018   TRIG 299.0 (H) 10/12/2018   CHOLHDL 5 10/12/2018   Lab Results  Component Value Date   WBC 15.3 (H) 04/27/2019   HGB 14.1 04/27/2019   HCT 42.4 04/27/2019   MCV 91.8 04/27/2019   PLT 239 04/27/2019   Attestation Statements:   Reviewed by clinician on day of visit: allergies, medications, problem list, medical history, surgical history, family history, social history, and previous encounter notes.  I, Water quality scientist, CMA, am acting as transcriptionist for Briscoe Deutscher, DO  I have reviewed the above documentation for accuracy and completeness, and I agree with the above. Briscoe Deutscher, DO

## 2021-01-29 ENCOUNTER — Encounter (INDEPENDENT_AMBULATORY_CARE_PROVIDER_SITE_OTHER): Payer: Self-pay | Admitting: Family Medicine

## 2021-01-29 ENCOUNTER — Other Ambulatory Visit: Payer: Self-pay

## 2021-01-29 ENCOUNTER — Ambulatory Visit (INDEPENDENT_AMBULATORY_CARE_PROVIDER_SITE_OTHER): Payer: 59 | Admitting: Family Medicine

## 2021-01-29 VITALS — BP 128/70 | HR 65 | Temp 98.5°F | Ht 67.0 in | Wt 211.0 lb

## 2021-01-29 DIAGNOSIS — Z853 Personal history of malignant neoplasm of breast: Secondary | ICD-10-CM

## 2021-01-29 DIAGNOSIS — E669 Obesity, unspecified: Secondary | ICD-10-CM | POA: Diagnosis not present

## 2021-01-29 DIAGNOSIS — E559 Vitamin D deficiency, unspecified: Secondary | ICD-10-CM | POA: Diagnosis not present

## 2021-01-29 DIAGNOSIS — Z6833 Body mass index (BMI) 33.0-33.9, adult: Secondary | ICD-10-CM

## 2021-01-29 DIAGNOSIS — R0989 Other specified symptoms and signs involving the circulatory and respiratory systems: Secondary | ICD-10-CM | POA: Diagnosis not present

## 2021-01-29 DIAGNOSIS — Z9189 Other specified personal risk factors, not elsewhere classified: Secondary | ICD-10-CM

## 2021-02-01 ENCOUNTER — Encounter (HOSPITAL_COMMUNITY): Payer: Self-pay | Admitting: *Deleted

## 2021-02-01 ENCOUNTER — Emergency Department (HOSPITAL_COMMUNITY)
Admission: EM | Admit: 2021-02-01 | Discharge: 2021-02-01 | Disposition: A | Discharge status: Home / Self Care | Payer: 59 | Attending: Emergency Medicine | Admitting: Emergency Medicine

## 2021-02-01 ENCOUNTER — Telehealth: Payer: Self-pay

## 2021-02-01 ENCOUNTER — Emergency Department (HOSPITAL_COMMUNITY): Payer: 59

## 2021-02-01 DIAGNOSIS — K529 Noninfective gastroenteritis and colitis, unspecified: Secondary | ICD-10-CM | POA: Diagnosis not present

## 2021-02-01 DIAGNOSIS — Z853 Personal history of malignant neoplasm of breast: Secondary | ICD-10-CM | POA: Diagnosis not present

## 2021-02-01 DIAGNOSIS — Z7982 Long term (current) use of aspirin: Secondary | ICD-10-CM | POA: Diagnosis not present

## 2021-02-01 DIAGNOSIS — R1030 Lower abdominal pain, unspecified: Secondary | ICD-10-CM | POA: Diagnosis present

## 2021-02-01 LAB — COMPREHENSIVE METABOLIC PANEL
ALT: 35 U/L (ref 0–44)
AST: 35 U/L (ref 15–41)
Albumin: 3.9 g/dL (ref 3.5–5.0)
Alkaline Phosphatase: 46 U/L (ref 38–126)
Anion gap: 11 (ref 5–15)
BUN: 17 mg/dL (ref 6–20)
CO2: 23 mmol/L (ref 22–32)
Calcium: 9.5 mg/dL (ref 8.9–10.3)
Chloride: 104 mmol/L (ref 98–111)
Creatinine, Ser: 0.87 mg/dL (ref 0.44–1.00)
GFR, Estimated: >60 mL/min (ref >60)
Glucose, Bld: 130 mg/dL — ABNORMAL HIGH (ref 70–99)
Potassium: 4.4 mmol/L (ref 3.5–5.1)
Sodium: 138 mmol/L (ref 135–145)
Total Bilirubin: 0.7 mg/dL (ref 0.3–1.2)
Total Protein: 7.2 g/dL (ref 6.5–8.1)

## 2021-02-01 LAB — URINALYSIS, ROUTINE W REFLEX MICROSCOPIC
Bilirubin Urine: NEGATIVE
Glucose, UA: NEGATIVE mg/dL
Hgb urine dipstick: NEGATIVE
Ketones, ur: 5 mg/dL — AB
Leukocytes,Ua: NEGATIVE
Nitrite: NEGATIVE
Protein, ur: NEGATIVE mg/dL
Specific Gravity, Urine: 1.02 (ref 1.005–1.030)
pH: 7 (ref 5.0–8.0)

## 2021-02-01 LAB — CBC
HCT: 42.8 % (ref 36.0–46.0)
Hemoglobin: 14.2 g/dL (ref 12.0–15.0)
MCH: 31.2 pg (ref 26.0–34.0)
MCHC: 33.2 g/dL (ref 30.0–36.0)
MCV: 94.1 fL (ref 80.0–100.0)
Platelets: 196 10*3/uL (ref 150–400)
RBC: 4.55 MIL/uL (ref 3.87–5.11)
RDW: 12.9 % (ref 11.5–15.5)
WBC: 14.8 10*3/uL — ABNORMAL HIGH (ref 4.0–10.5)
nRBC: 0 % (ref 0.0–0.2)

## 2021-02-01 LAB — LIPASE, BLOOD: Lipase: 26 U/L (ref 11–51)

## 2021-02-01 MED ORDER — SODIUM CHLORIDE 0.9 % IV BOLUS
1000.0000 mL | Freq: Once | INTRAVENOUS | Status: AC
  Administered 2021-02-01: 1000 mL via INTRAVENOUS

## 2021-02-01 MED ORDER — CIPROFLOXACIN HCL 500 MG PO TABS: 500.0000 mg | ORAL_TABLET | Freq: Two times a day (BID) | ORAL | 0 refills | Status: DC

## 2021-02-01 MED ORDER — METRONIDAZOLE 500 MG PO TABS: 500.0000 mg | ORAL_TABLET | Freq: Two times a day (BID) | ORAL | 0 refills | Status: DC

## 2021-02-01 NOTE — Discharge Instructions (Signed)
We sent a prescription for antibiotics to your pharmacy.  Get plenty of rest and try to drink more fluids.  Use the attached information for dietary suggestions.  Follow-up with your PCP or GI doctor for further care and treatment as needed.

## 2021-02-01 NOTE — Progress Notes (Signed)
Chief Complaint:   OBESITY Alexandra Fitzpatrick is here to discuss her progress with her obesity treatment plan along with follow-up of her obesity related diagnoses.   Today's visit was #: 11 Starting weight: 215 lbs Starting date: 03/09/2020 Today's weight: 211 lbs Today's date: 01/29/2021 Weight change since last visit: 3 lbs Total lbs lost to date: 4 lbs Body mass index is 33.05 kg/m.  Total weight loss percentage to date: -1.86%  Interim History:  [name] says that Factor meals are still helping at 400-600 calories, 45 grams of protein, low carb, high fiber.  She has them 4 times per week for dinner.  She tested positive for OSA and has been using a mouth guard for a week.  She says it is helping her to get out of bed easier.  On June 20 she will be going to [facility] for her 6 month follow up with Oncology.  [name] provided the following food recall today: Breakfast:  Yogurt and fruit. Lunch:  Deconstructed sandwich. Dinner:  Factor meal 4 days per week.  Current Meal Plan: keeping a food journal and adhering to recommended goals of 1500 calories and 95 grams of protein for 80% of the time.  Current Exercise Plan: Walking for 45 minutes 7 times per week.  Assessment/Plan:   1. 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  Taking Effexor-XR 75 mg daily. We will continue to monitor symptoms as they relate to her weight loss journey.  2. History of right breast cancer She is taking tamoxifen 20 mg daily.  It will be 5 years in November 2022.  She will see her Oncologist next month.  3. Vitamin D deficiency [name] is taking vitamin D 1,000 IU daily. Optimal goal > 50 ng/dL.   Plan: Continue current OTC vitamin D supplementation.  Follow-up for routine testing of Vitamin D, at least 2-3 times per year to avoid over-replacement.  4. At risk for heart disease Due to [name]'s current state of health and medical condition(s), she is at a higher risk for heart disease.  This puts the patient at  much greater risk to subsequently develop cardiopulmonary conditions that can significantly affect patient's quality of life in a negative manner.    At least 8 minutes were spent on counseling Darya about these concerns today. Evidence-based interventions for health behavior change were utilized today including the discussion of self monitoring techniques, problem-solving barriers, and SMART goal setting techniques.  Specifically, regarding patient's less desirable eating habits and patterns, we employed the technique of small changes when [name] has not been able to fully commit to her prudent nutritional plan.  5. Obesity, current BMI 33.1  Course: [name] is currently in the action stage of change. As such, her goal is to continue with weight loss efforts.   Nutrition goals: She has agreed to keeping a food journal and adhering to recommended goals of 1500 calories and 95 grams of protein.   Exercise goals: For substantial health benefits, adults should do at least 150 minutes (2 hours and 30 minutes) a week of moderate-intensity, or 75 minutes (1 hour and 15 minutes) a week of vigorous-intensity aerobic physical activity, or an equivalent combination of moderate- and vigorous-intensity aerobic activity. Aerobic activity should be performed in episodes of at least 10 minutes, and preferably, it should be spread throughout the week.  Behavioral modification strategies: increasing lean protein intake, decreasing simple carbohydrates and increasing vegetables.  [name] has agreed to follow-up with our clinic in 6 weeks. She was informed  of the importance of frequent follow-up visits to maximize her success with intensive lifestyle modifications for her multiple health conditions.   Objective:   Blood pressure 128/70, pulse 65, temperature 98.5 F (36.9 C), temperature source Oral, height 5\' 7"  (1.702 m), weight 211 lb (95.7 kg), SpO2 97 %. Body mass index is 33.05 kg/m.  General: Cooperative,  alert, well developed, in no acute distress. HEENT: Conjunctivae and lids unremarkable. Cardiovascular: Regular rhythm.  Lungs: Normal work of breathing. Neurologic: No focal deficits.   Lab Results  Component Value Date   CREATININE 0.87 02/01/2021   BUN 17 02/01/2021   NA 138 02/01/2021   K 4.4 02/01/2021   CL 104 02/01/2021   CO2 23 02/01/2021   Lab Results  Component Value Date   ALT 35 02/01/2021   AST 35 02/01/2021   ALKPHOS 46 02/01/2021   BILITOT 0.7 02/01/2021   Lab Results  Component Value Date   HGBA1C 6.5 10/12/2018   HGBA1C 5.8 (A) 06/29/2018   Lab Results  Component Value Date   CHOL 199 10/12/2018   HDL 38.90 (L) 10/12/2018   LDLDIRECT 130.0 10/12/2018   TRIG 299.0 (H) 10/12/2018   CHOLHDL 5 10/12/2018   Lab Results  Component Value Date   WBC 14.8 (H) 02/01/2021   HGB 14.2 02/01/2021   HCT 42.8 02/01/2021   MCV 94.1 02/01/2021   PLT 196 02/01/2021   Attestation Statements:   Reviewed by clinician on day of visit: allergies, medications, problem list, medical history, surgical history, family history, social history, and previous encounter notes.  I, Water quality scientist, CMA, am acting as transcriptionist for Briscoe Deutscher, DO  I have reviewed the above documentation for accuracy and completeness, and I agree with the above. Briscoe Deutscher, DO

## 2021-02-01 NOTE — ED Triage Notes (Signed)
Pt complains of lower abdominal cramping since last night. Vomited once last night, no blood. Then started having soft stools, following by bright red bloody stools. Pt had similar episode 2.5 years ago, diagnosed with colitis.

## 2021-02-01 NOTE — Telephone Encounter (Signed)
Nurse Assessment Nurse: Vallery Sa, RN, Cathy Date/Time (Eastern Time): 02/01/2021 8:38:29 AM Confirm and document reason for call. If symptomatic, describe symptoms. ---Alexandra Fitzpatrick states she developed abdominal cramping last night and that had blood in her bowel movements twice this morning. She vomited one time last night (no blood in her emesis). No fever. No injury. Alert and responsive. Does the patient have any new or worsening symptoms? ---Yes Will a triage be completed? ---Yes Related visit to physician within the last 2 weeks? ---No Does the PT have any chronic conditions? (i.e. diabetes, asthma, this includes High risk factors for pregnancy, etc.) ---Yes List chronic conditions. ---Breast Cancer 2017, Colitis in the past Is this a behavioral health or substance abuse call? ---No Guidelines Guideline Title Affirmed Question Affirmed Notes Nurse Date/Time (Eastern Time) Rectal Bleeding [1] Constant abdominal pain AND [2] present > 2 hours Vallery Sa, RN, [place] 02/01/2021 8:41:32 AM PLEASE NOTE: All timestamps contained within this report are represented as [place] Standard Time. CONFIDENTIALTY NOTICE: This fax transmission is intended only for the addressee. It contains information that is legally privileged, confidential or otherwise protected from use or disclosure. If you are not the intended recipient, you are strictly prohibited from reviewing, disclosing, copying using or disseminating any of this information or taking any action in reliance on or regarding this information. If you have received this fax in error, please notify us immediately by telephone so that we can arrange for its return to [place]. Phone: [phone], Toll-Free: [phone], Fax: [phone] Page: 2 of 2 Call Id: [id number] [place]. Time Eilene Ghazi Time) Disposition Final User 02/01/2021 8:43:39 AM Go to ED Now Yes Vallery Sa, RN, Rosey [place] Disagree/Comply Comply Caller Understands Yes PreDisposition Call  Doctor Care Advice Given Per Guideline GO TO ED NOW: * You need to be seen in the Emergency Department. * Go to the ED at ___________ [place] now. Drive carefully. BRING MEDICINES: * Bring a list of your current medicines when you go to the Emergency Department (ER). * Bring the pill bottles too. This will help the doctor (or NP/PA) to make certain you are taking the right medicines and the right dose. CARE ADVICE given per Rectal Bleeding (Adult) guideline. Referrals GO TO FACILITY OTHER - SPECIF

## 2021-02-01 NOTE — ED Provider Notes (Signed)
Emergency Medicine Provider Triage Evaluation Note  Alexandra Fitzpatrick , a 60 y.o. female  was evaluated in triage.  Pt complains of crampy bilateral lower abdominal pain.  She has history of constipation.  Has history of colitis, about 2 years ago, subsequently she was evaluated colonoscopy which was normal.  She states that over the night last night she had urgency to stool, initially unable to then have multiple stools which were "like sludge."  Initially the stools were brown then turned to a red color that made her think it was blood.  She denies fever, chills, cough or shortness of breath.  She had a single episode of nonbloody emesis, last night.  Review of Systems  Positive: Abdominal pain, frequent stooling, blood in stool Negative: No dizziness, no weakness, no paresthesia.  Physical Exam  BP (!) 166/77 (BP Location: Right Arm)   Pulse 68   Temp 99.3 F (37.4 C) (Oral)   Resp 16   Ht 5\' 8"  (1.727 m)   Wt 95.3 kg   SpO2 100%   BMI 31.93 kg/m  Gen:   Awake, no distress   Resp:  Normal effort  MSK:   Moves extremities without difficulty  Other:  Nontoxic appearance.  She touches her abdomen without pain  Medical Decision Making  Medically screening exam initiated at 10:23 AM.  Appropriate orders placed.  [name] was informed that the remainder of the evaluation will be completed by another provider, this initial triage assessment does not replace that evaluation, and the importance of remaining in the ED until their evaluation is complete.  Evaluation ordered including CT scan, labs, IV fluids.   Daleen Bo, MD 02/01/21 1049

## 2021-02-01 NOTE — ED Provider Notes (Signed)
[facility] DEPT Provider Note   CSN: [id number] Arrival date & time: 02/01/21  1002     History Chief Complaint  Patient presents with  . Abdominal Pain  . Rectal Bleeding    Alexandra Fitzpatrick is a 60 y.o. female.  HPI She presents for evaluation of crampy lower abdominal pain for 2 days associated with frequent stooling, and blood in stools after initially no blood.  History of similar problem 2 years ago.  She has had a negative colonoscopy in the interim.  She denies fever, chills, weakness, dizziness, chest pain, persistent nausea or vomiting.  She did have 1 episode of vomiting during the night last night.  There was no blood in it.  No prior abdominal surgery.    Past Medical History:  Diagnosis Date  . Abnormal cholesterol test   . Allergies   . Amenorrhea   . Anxiety   . B12 deficiency   . Back pain   . Breast cancer ([place]) 2017   s/p surgery and radiation  . Constipation   . Depression   . Dysmenorrhea   . Fatty liver   . High blood pressure    pt denies  . High blood sugar   . High triglycerides    pt denies  . Migraines   . Pre-diabetes   . Scheuermann's disease   . Sleep apnea    pt stated she wa snever dx  . Vitamin D deficiency     Patient Active Problem List   Diagnosis Date Noted  . Colitis 04/12/2020  . Family history of colon cancer 04/12/2020  . Migraine 02/22/2020  . Use of tamoxifen (Nolvadex) 02/22/2020  . Obesity 02/22/2020  . Prediabetes 06/29/2018  . MDD (major depressive disorder), recurrent, in full remission ([place]) 02/18/2017  . Breast cancer of lower-outer quadrant of right female breast ([employer]) 01/30/2016  . KCS (keratoconjunctivitis sicca) ([place]) 05/10/2014  . Myopic astigmatism of both eyes 05/10/2014    Past Surgical History:  Procedure Laterality Date  . BREAST LUMPECTOMY Right 2017  . GUM SURGERY     alloderm inplantation  . WISDOM TOOTH EXTRACTION       OB History    Gravida  2    Para  2   Term  2   Preterm  0   AB  0   Living  2     SAB  0   IAB  0   Ectopic  0   Multiple  0   Live Births  2           Family History  Problem Relation Age of Onset  . Cancer Mother        mouth  . Early death Mother   . Esophageal cancer Mother   . Arthritis Father   . Hypertension Father   . Sleep apnea Father   . Depression Sister   . Breast cancer Sister   . Arthritis Maternal Grandmother   . Cancer Maternal Grandmother        septocemia  . Arthritis Maternal Grandfather   . COPD Maternal Grandfather   . Lung cancer Maternal Grandfather        smoker  . Arthritis Paternal Grandmother   . Colon cancer Paternal Grandmother        28's  . Alzheimer's disease Paternal Grandmother        ?  Marland Kitchen Arthritis Paternal Grandfather   . Lung cancer Paternal Grandfather  abspetosis  . Other Daughter        erythromelalgia  . Rectal cancer Neg Hx   . Stomach cancer Neg Hx     Social History   Tobacco Use  . Smoking status: Never Smoker  . Smokeless tobacco: Never Used  Vaping Use  . Vaping Use: Never used  Substance Use Topics  . Alcohol use: Not Currently  . Drug use: Never    Home Medications Prior to Admission medications   Medication Sig Start Date End Date Taking? Authorizing Provider  ciprofloxacin (CIPRO) 500 MG tablet Take 1 tablet (500 mg total) by mouth 2 (two) times daily. One po bid x 7 days 02/01/21  Yes Daleen Bo, MD  metroNIDAZOLE (FLAGYL) 500 MG tablet Take 1 tablet (500 mg total) by mouth 2 (two) times daily. One po bid x 7 days 02/01/21  Yes Daleen Bo, MD  AMBULATORY NON FORMULARY MEDICATION Apply 1 application topically daily. Medication Name: Azelaic Acid 15%, Metronidazole 1%, Ivermectin 1% Cream ([employer] [facility]) 10/12/20   Warren Danes, PA-C  aspirin EC 81 MG tablet Take 81 mg by mouth daily.    [provider]  betamethasone valerate ointment (VALISONE) 0.1 % Apply 1 application topically 2  (two) times daily. Use for up to 2 weeks as needed 05/11/20   Salvadore Dom, MD  carboxymethylcellulose (REFRESH PLUS) 0.5 % SOLN 1 drop 3 (three) times daily as needed.    [provider]  Cholecalciferol 25 MCG (1000 UT) tablet Take 1,000 Units by mouth daily.     [provider]  ketoconazole (NIZORAL) 2 % shampoo Apply topically. 12/23/18   [provider]  loratadine (CLARITIN) 10 MG tablet Take 10 mg by mouth daily.    [provider]  Magnesium Citrate 100 MG TABS Take by mouth.    [provider]  Multiple Vitamins-Minerals (CENTRUM ADULTS PO) Take by mouth.    [provider]  SUMAtriptan (IMITREX) 50 MG tablet TAKE 1 TABLET BY MOUTH AS NEEDED FOR MIGRAINE 05/19/18   [provider]  tamoxifen (NOLVADEX) 20 MG tablet Take 20 mg by mouth daily.    [provider]  venlafaxine XR (EFFEXOR-XR) 75 MG 24 hr capsule Take 1 capsule (75 mg total) by mouth daily with breakfast. 03/27/20   [name], PA  vitamin B-12 (CYANOCOBALAMIN) 1000 MCG tablet Take 1,000 mcg by mouth daily.    [provider]    Allergies    Codeine  Review of Systems   Review of Systems  All other systems reviewed and are negative.   Physical Exam Updated Vital Signs BP (!) 151/71 (BP Location: Right Arm)   Pulse 66   Temp 99.3 F (37.4 C) (Oral)   Resp 16   Ht 5\' 8"  (1.727 m)   Wt 95.3 kg   SpO2 100%   BMI 31.93 kg/m   Physical Exam Vitals and nursing note reviewed.  Constitutional:      Appearance: She is well-developed.  HENT:     Head: Normocephalic and atraumatic.     Right Ear: External ear normal.     Left Ear: External ear normal.  Eyes:     Conjunctiva/sclera: Conjunctivae normal.     Pupils: Pupils are equal, round, and reactive to light.  Neck:     Trachea: Phonation normal.  Cardiovascular:     Rate and Rhythm: Normal rate and regular rhythm.     Heart sounds: Normal heart sounds.  Pulmonary:  Effort: Pulmonary effort is normal.     Breath sounds: Normal breath sounds.  Abdominal:     General: There is no distension.     Palpations: Abdomen is soft.     Tenderness: There is no abdominal tenderness.  Musculoskeletal:        General: Normal range of motion.     Cervical back: Normal range of motion and neck supple.  Skin:    General: Skin is warm and dry.  Neurological:     Mental Status: She is alert and oriented to person, place, and time.     Cranial Nerves: No cranial nerve deficit.     Sensory: No sensory deficit.     Motor: No abnormal muscle tone.     Coordination: Coordination normal.  Psychiatric:        Mood and Affect: Mood normal.        Behavior: Behavior normal.        Thought Content: Thought content normal.        Judgment: Judgment normal.     ED Results / Procedures / Treatments   Labs (all labs ordered are listed, but only abnormal results are displayed) Labs Reviewed  COMPREHENSIVE METABOLIC PANEL - Abnormal; Notable for the following components:      Result Value   Glucose, Bld 130 (*)    All other components within normal limits  CBC - Abnormal; Notable for the following components:   WBC 14.8 (*)    All other components within normal limits  LIPASE, BLOOD  URINALYSIS, ROUTINE W REFLEX MICROSCOPIC    EKG None  Radiology CT Abdomen Pelvis Wo Contrast  Result Date: 02/01/2021 CLINICAL DATA:  Lower abdominal pain and history of constipation EXAM: CT ABDOMEN AND PELVIS WITHOUT CONTRAST TECHNIQUE: Multidetector CT imaging of the abdomen and pelvis was performed following the standard protocol without IV contrast. COMPARISON:  04/27/2019 FINDINGS: Lower chest: No acute abnormality. Hepatobiliary: Fatty infiltration of the liver is noted. The gallbladder is within normal limits. Pancreas: Unremarkable. No pancreatic ductal dilatation or surrounding inflammatory changes. Spleen: Normal in size without focal abnormality. Adrenals/Urinary Tract:  Adrenal glands are within normal limits. Kidneys are well visualized bilaterally. No renal calculi or obstructive changes are noted. The bladder is partially distended. Stomach/Bowel: The appendix is well visualized and within normal limits. Very mild inflammatory changes of the distal descending colon and proximal sigmoid are noted without perforation or abscess. No significant diverticular changes noted in these changes are likely related to focal colitis. Small bowel and stomach are within normal limits. Vascular/Lymphatic: No significant vascular findings are present. No enlarged abdominal or pelvic lymph nodes. Reproductive: Uterus and bilateral adnexa are unremarkable. Other: No abdominal wall hernia or abnormality. No abdominopelvic ascites. Musculoskeletal: No acute or significant osseous findings. IMPRESSION: Changes consistent with mild colitis in the distal descending and proximal sigmoid colons. Fatty liver. No other focal abnormality is noted. Electronically Signed   By: Inez Catalina M.D.   On: 02/01/2021 11:27    Procedures Procedures   Medications Ordered in ED Medications  sodium chloride 0.9 % bolus 1,000 mL (1,000 mLs Intravenous New Bag/Given 02/01/21 1302)    ED Course  I have reviewed the triage vital signs and the nursing notes.  Pertinent labs & imaging results that were available during my care of the patient were reviewed by me and considered in my medical decision making (see chart for details).    MDM Rules/Calculators/A&P  Patient Vitals for the past 24 hrs:  BP Temp Temp src Pulse Resp SpO2 Height Weight  02/01/21 1239 (!) 151/71 -- -- 66 16 100 % -- --  02/01/21 1017 (!) 166/77 99.3 F (37.4 C) Oral 68 16 100 % 5\' 8"  (1.727 m) 95.3 kg    1:14 PM Reevaluation with update and discussion. After initial assessment and treatment, an updated evaluation reveals she is comfortable at this time has no further complaints.  Findings discussed and  questions answered. Daleen Bo   Medical Decision Making:  This patient is presenting for evaluation of abdominal pain with bloody stool, which does require a range of treatment options, and is a complaint that involves a moderate risk of morbidity and mortality. The differential diagnoses include enteritis, gastritis, colitis. I decided to review old records, and in summary middle-aged female presenting with recurrent symptoms, as to when she previously had colitis.  I did not require additional historical information from anyone.  Clinical Laboratory Tests Ordered, included CBC, Metabolic panel and Urinalysis. Review indicates normal except white count high, glucose high. Radiologic Tests Ordered, included CT abdomen pelvis.  I independently Visualized: Radiograph images, which show mild colitis, left-sided; no perforation or intra-abdominal abscess   Critical Interventions-clinical evaluation, laboratory testing, CT imaging, observation and reassessment  After These Interventions, the Patient was reevaluated and was found stable for discharge.  Patient with mild recurrent Colitis.  Unclear etiology.  Mild elevation of white count but no other significant abnormalities.  We will reinitiate trial of antibiotics, instruct on low fiber diet, and instructed her to follow-up with her doctors for further care and treatment.  CRITICAL CARE-no Performed by: Daleen Bo  Nursing Notes Reviewed/ Care Coordinated Applicable Imaging Reviewed Interpretation of Laboratory Data incorporated into ED treatment  The patient appears reasonably screened and/or stabilized for discharge and I doubt any other medical condition or other [facility] requiring further screening, evaluation, or treatment in the ED at this time prior to discharge.  Plan: Home Medications-continue usual; Home Treatments-rest, fluids; return here if the recommended treatment, does not improve the symptoms; Recommended follow up-PCP,  PR     Final Clinical Impression(s) / ED Diagnoses Final diagnoses:  Colitis    Rx / DC Orders ED Discharge Orders         Ordered    ciprofloxacin (CIPRO) 500 MG tablet  2 times daily        02/01/21 1311    metroNIDAZOLE (FLAGYL) 500 MG tablet  2 times daily        02/01/21 1311           Daleen Bo, MD 02/01/21 1315

## 2021-02-02 NOTE — Telephone Encounter (Signed)
Called patient. She states she went to [facility]. They did a CT per patient and she was diagnosed with colitis. Patient states she was given antibiotics. She states she is feeling much better and the cramping is not as bad. She states she is not having any bleeding at this time.

## 2021-02-28 LAB — HM MAMMOGRAPHY

## 2021-03-12 ENCOUNTER — Encounter (INDEPENDENT_AMBULATORY_CARE_PROVIDER_SITE_OTHER): Payer: Self-pay | Admitting: Family Medicine

## 2021-03-14 ENCOUNTER — Ambulatory Visit (INDEPENDENT_AMBULATORY_CARE_PROVIDER_SITE_OTHER): Payer: 59 | Admitting: Family Medicine

## 2021-07-02 NOTE — Progress Notes (Signed)
Subjective:    Alexandra Fitzpatrick is a 60 y.o. female and is here for a comprehensive physical exam.  HPI  Health Maintenance Due  Topic Date Due   Pneumococcal Vaccine 67-80 Years old (1 - PCV) Never done   HIV Screening  Never done   MAMMOGRAM  02/18/2020    Acute Concerns: Pre Diabetes/ Weight Loss Alexandra Fitzpatrick has been struggling with losing weight and maintaining weight loss, but has told me she is aware the currently being on tamoxifen could be making it difficult. She is mildly active but is also trying to improve her diet and do what she can to balance out her A1c. Alexandra Fitzpatrick has been cooking at home with healthier ingredients and looking for healthier alternatives/recipes.  She has since discontinued visiting Dr. Juleen China due to belief that it was no benefiting her as much as she would've hoped.  Fatigue/Depression Pt expresses she has been experiencing difficulty getting up in the morning. She is used to being able to get up at a certain time of the morning with relatively no problems. At this time she is on Effexor XR to tx her depression as well as tamoxifen 20 mg but does not believe the medications are fully the cause. Alexandra Fitzpatrick has reported having COVID about two years ago and expressed she could be experiencing lasting effectives from having COVID.   At one point she believed it to be sleep apnea induced, which led her to visiting her dentist and receiving a mouth guard that she report has been helpful. She expresses possibility in being hard on herself but mainly is seeking a solution to getting her back to her baseline.   Chronic Issues: Migraines Currently compliant with taking imitrex 50 mg with no adverse effects. She is managing well with reports of not experiencing an episode in a couple years.   Hx of Breast Cancer Alexandra Fitzpatrick states she has been continuing to see her health team in [place], [place], [place], for follow ups concerning her breast cancer.  She  has been compliant with tamoxifen 20 mg and is hopeful she will be able to stop this medication upon her next visit to [place].   Health Maintenance: Immunizations -- COVID- Last completed 01/20/20 (Moderna- 2 doses) Influenza Vaccine- Last completed 07/01/20 Tdap- Last completed 02/18/17 Colonoscopy -- Last completed 06/15/20 Mammogram -- Last completed 02/17/18 PAP -- Last done 10/16/18 Bone Density -- N/A Diet -- Trying to improve diet to balance A1c Sleep habits -- Normal sleep schedule Exercise -- Light to moderate Weight -- Stable Mood -- Stable Weight history: Wt Readings from Last 10 Encounters:  07/03/21 216 lb 8 oz (98.2 kg)  02/01/21 210 lb (95.3 kg)  01/29/21 211 lb (95.7 kg)  12/25/20 214 lb (97.1 kg)  09/14/20 211 lb (95.7 kg)  08/15/20 211 lb (95.7 kg)  07/11/20 211 lb (95.7 kg)  06/15/20 213 lb (96.6 kg)  06/14/20 208 lb (94.3 kg)  05/17/20 208 lb (94.3 kg)   Body mass index is 33.41 kg/m. No LMP recorded. Patient is postmenopausal. Alcohol use:  reports that she does not currently use alcohol. Tobacco use:  Tobacco Use: Low Risk    Smoking Tobacco Use: Never   Smokeless Tobacco Use: Never   Passive Exposure: Not on file     Depression screen [facility] 2/9 03/09/2020  Decreased Interest 2  Down, Depressed, Hopeless 3  PHQ - 2 Score 5  Altered sleeping 2  Tired, decreased energy 1  Change  in appetite 1  Feeling bad or failure about yourself  0  Trouble concentrating 1  Moving slowly or fidgety/restless 0  Suicidal thoughts 0  PHQ-9 Score 10  Difficult doing work/chores Not difficult at all     Other providers/specialists: Patient Care Team: [name], [place] as PCP - General (Physician Assistant) Warren Danes, PA-C as Physician Assistant (Dermatology)    PMHx, SurgHx, SocialHx, Medications, and Allergies were reviewed in the Visit Navigator and updated as appropriate.   Past Medical History:  Diagnosis Date   Abnormal cholesterol test     Allergies    Amenorrhea    Anxiety    B12 deficiency    Back pain    Breast cancer ([place]) 2017   s/p surgery and radiation   Constipation    Depression    Dysmenorrhea    Fatty liver    High blood pressure    pt denies   High blood sugar    High triglycerides    pt denies   Migraines    Pre-diabetes    Scheuermann's disease    Sleep apnea    pt stated she wa snever dx   Vitamin D deficiency      Past Surgical History:  Procedure Laterality Date   BREAST LUMPECTOMY Right 2017   GUM SURGERY     alloderm inplantation   WISDOM TOOTH EXTRACTION       Family History  Problem Relation Age of Onset   Cancer Mother        mouth   Early death Mother    Esophageal cancer Mother    Arthritis Father    Hypertension Father    Sleep apnea Father    Depression Sister    Breast cancer Sister    Arthritis Maternal Grandmother    Cancer Maternal Grandmother        septocemia   Arthritis Maternal Grandfather    COPD Maternal Grandfather    Lung cancer Maternal Grandfather        smoker   Arthritis Paternal Grandmother    Colon cancer Paternal Grandmother        75's   Alzheimer's disease Paternal Grandmother        ?   Arthritis Paternal Grandfather    Lung cancer Paternal Grandfather        abspetosis   Other Daughter        erythromelalgia   Rectal cancer Neg Hx    Stomach cancer Neg Hx     Social History   Tobacco Use   Smoking status: Never   Smokeless tobacco: Never  Vaping Use   Vaping Use: Never used  Substance Use Topics   Alcohol use: Not Currently   Drug use: Never    Review of Systems:   Review of Systems  Constitutional:  Positive for malaise/fatigue. Negative for chills, fever and weight loss.  HENT:  Negative for hearing loss, sinus pain and sore throat.   Respiratory:  Negative for cough and hemoptysis.   Cardiovascular:  Negative for chest pain, palpitations, leg swelling and PND.  Gastrointestinal:  Negative for abdominal pain,  constipation, diarrhea, heartburn, nausea and vomiting.  Genitourinary:  Negative for dysuria, frequency and urgency.  Musculoskeletal:  Negative for back pain, myalgias and neck pain.  Skin:  Negative for itching and rash.  Neurological:  Negative for dizziness, tingling, seizures and headaches.  Endo/Heme/Allergies:  Negative for polydipsia.  Psychiatric/Behavioral:  Negative for depression. The patient is not nervous/anxious.  Objective:   BP 130/68 (BP Location: Left Arm, Patient Position: Sitting, Cuff Size: Large)   Pulse 76   Temp 97.7 F (36.5 C) (Temporal)   Ht 5' 7.5" (1.715 m)   Wt 216 lb 8 oz (98.2 kg)   SpO2 98%   BMI 33.41 kg/m  Body mass index is 33.41 kg/m.   General Appearance:    Alert, cooperative, no distress, appears stated age  Head:    Normocephalic, without obvious abnormality, atraumatic  Eyes:    PERRL, conjunctiva/corneas clear, EOM's intact, fundi    benign, both eyes  Ears:    Normal TM's and external ear canals, both ears  Nose:   Nares normal, septum midline, mucosa normal, no drainage    or sinus tenderness  Throat:   Lips, mucosa, and tongue normal; teeth and gums normal  Neck:   Supple, symmetrical, trachea midline, no adenopathy;    thyroid:  no enlargement/tenderness/nodules; no carotid   bruit or JVD  Back:     Symmetric, no curvature, ROM normal, no CVA tenderness  Lungs:     Clear to auscultation bilaterally, respirations unlabored  Chest Wall:    No tenderness or deformity   Heart:    Regular rate and rhythm, S1 and S2 normal, no murmur, rub or gallop  Breast Exam:    Deferred  Abdomen:     Soft, non-tender, bowel sounds active all four quadrants,    no masses, no organomegaly  Genitalia:   Deferred  Extremities:   Extremities normal, atraumatic, no cyanosis or edema  Pulses:   2+ and symmetric all extremities  Skin:   Skin color, texture, turgor normal, no rashes or lesions  Lymph nodes:   Cervical, supraclavicular, and axillary  nodes normal  Neurologic:   CNII-XII intact, normal strength, sensation and reflexes    throughout    Assessment/Plan:   Encounter for general adult medical examination with abnormal findings Today patient counseled on age appropriate routine health concerns for screening and prevention, each reviewed and up to date or declined. Immunizations reviewed and up to date or declined. Labs ordered and reviewed. Risk factors for depression reviewed and negative. Hearing function and visual acuity are intact. ADLs screened and addressed as needed. Functional ability and level of safety reviewed and appropriate. Education, counseling and referrals performed based on assessed risks today. Patient provided with a copy of personalized plan for preventive services.  She is getting regular lab in [facility] with her oncology team. We deferred blood work today.  Other fatigue No red flags on discussion Continue to work on healthy lifestyle and add in some exercise as able Follow-up if symptoms worsen/persist She is planning to discuss with oncology as well in case this is tamoxifen side effect  Prediabetes Last A1c 6.4% over summer Will defer blood work to oncology team in December Continue to work on healthy lifestyle efforts  Obesity, unspecified classification, unspecified obesity type, unspecified whether serious comorbidity present Continue healthy eating and exercise  Recurrent major depressive disorder, in remission ([place]), with emotional eating Well controlled  Continue effexor 75 mg daily I discussed with patient that if they develop any SI, to tell someone immediately and seek medical attention.  Migraine without aura and without status migrainosus, not intractable Well controlled Continue prn imitrex Follow-up as needed    Patient Counseling: [x]    Nutrition: Stressed importance of moderation in sodium/caffeine intake, saturated fat and cholesterol, caloric balance, sufficient intake of  fresh fruits, vegetables, fiber, calcium, iron, and  1 mg of folate supplement per day (for females capable of pregnancy).  [x]    Stressed the importance of regular exercise.   [x]    Substance Abuse: Discussed cessation/primary prevention of tobacco, alcohol, or other drug use; driving or other dangerous activities under the influence; availability of treatment for abuse.   [x]    Injury prevention: Discussed safety belts, safety helmets, smoke detector, smoking near bedding or upholstery.   [x]    Sexuality: Discussed sexually transmitted diseases, partner selection, use of condoms, avoidance of unintended pregnancy  and contraceptive alternatives.  [x]    Dental health: Discussed importance of regular tooth brushing, flossing, and dental visits.  [x]    Health maintenance and immunizations reviewed. Please refer to Health maintenance section.   I,Havlyn C Ratchford,acting as a Education administrator for [employer], PA.,have documented all relevant documentation on the behalf of [name], PA,as directed by  [name], PA while in the presence of [name], [place].   I, [name], [place], have reviewed all documentation for this visit. The documentation on 07/03/21 for the exam, diagnosis, procedures, and orders are all accurate and complete.   [name], PA-C [place]

## 2021-07-03 ENCOUNTER — Encounter: Payer: Self-pay | Admitting: Physician Assistant

## 2021-07-03 ENCOUNTER — Ambulatory Visit (INDEPENDENT_AMBULATORY_CARE_PROVIDER_SITE_OTHER): Payer: 59 | Admitting: Physician Assistant

## 2021-07-03 ENCOUNTER — Other Ambulatory Visit: Payer: Self-pay

## 2021-07-03 VITALS — BP 130/68 | HR 76 | Temp 97.7°F | Ht 67.5 in | Wt 216.5 lb

## 2021-07-03 DIAGNOSIS — Z0001 Encounter for general adult medical examination with abnormal findings: Secondary | ICD-10-CM | POA: Diagnosis not present

## 2021-07-03 DIAGNOSIS — R5383 Other fatigue: Secondary | ICD-10-CM | POA: Diagnosis not present

## 2021-07-03 DIAGNOSIS — R7303 Prediabetes: Secondary | ICD-10-CM

## 2021-07-03 DIAGNOSIS — E669 Obesity, unspecified: Secondary | ICD-10-CM | POA: Diagnosis not present

## 2021-07-03 DIAGNOSIS — G43009 Migraine without aura, not intractable, without status migrainosus: Secondary | ICD-10-CM

## 2021-07-03 DIAGNOSIS — F3340 Major depressive disorder, recurrent, in remission, unspecified: Secondary | ICD-10-CM

## 2021-07-03 NOTE — Patient Instructions (Signed)
It was great to see you!  Keep up the good work!  Take care,  Viviana Trimble      

## 2021-07-30 ENCOUNTER — Other Ambulatory Visit: Payer: Self-pay

## 2021-07-30 ENCOUNTER — Ambulatory Visit: Payer: 59 | Admitting: Obstetrics and Gynecology

## 2021-07-30 VITALS — BP 150/62 | HR 77 | Ht 67.5 in | Wt 219.0 lb

## 2021-07-30 DIAGNOSIS — N760 Acute vaginitis: Secondary | ICD-10-CM

## 2021-07-30 LAB — WET PREP FOR TRICH, YEAST, CLUE

## 2021-07-30 MED ORDER — BETAMETHASONE VALERATE 0.1 % EX OINT: TOPICAL_OINTMENT | CUTANEOUS | 0 refills | Status: DC

## 2021-07-30 NOTE — Progress Notes (Signed)
GYNECOLOGY  VISIT   HPI: 60 y.o.   Married White or Caucasian Not Hispanic or Latino  female   [phone] with Patient's last menstrual period was 10/11/2016.   here for patient states that she has had intermittent itching for about 2 months. She has self treated with monistat twice but is still having very mild itching. She has also felt some bumps on her vulva, not painful.   GYNECOLOGIC HISTORY: Patient's last menstrual period was 10/11/2016. Contraception:PMP  Menopausal hormone therapy: none         OB History     Gravida  2   Para  2   Term  2   Preterm  0   AB  0   Living  2      SAB  0   IAB  0   Ectopic  0   Multiple  0   Live Births  2              Patient Active Problem List   Diagnosis Date Noted   Colitis 04/12/2020   Family history of colon cancer 04/12/2020   Migraine 02/22/2020   Use of tamoxifen (Nolvadex) 02/22/2020   Obesity 02/22/2020   Prediabetes 06/29/2018   MDD (major depressive disorder), recurrent, in full remission ([place]) 02/18/2017   Breast cancer of lower-outer quadrant of right female breast ([place]) 01/30/2016   KCS (keratoconjunctivitis sicca) ([place]) 05/10/2014   Myopic astigmatism of both eyes 05/10/2014    Past Medical History:  Diagnosis Date   Abnormal cholesterol test    Allergies    Allergy 2014   Amenorrhea    Anxiety    B12 deficiency    Back pain    Breast cancer ([place]) 2017   s/p surgery and radiation   Cataract 2018   Constipation    Depression    Dysmenorrhea    Fatty liver    High blood pressure    pt denies   High blood sugar    High triglycerides    pt denies   Migraines    Pre-diabetes    Scheuermann's disease    Sleep apnea    pt stated she wa snever dx   Vitamin D deficiency     Past Surgical History:  Procedure Laterality Date   BREAST LUMPECTOMY Right 2017   GUM SURGERY     alloderm inplantation   WISDOM TOOTH EXTRACTION      Current Outpatient Medications  Medication Sig Dispense  Refill   aspirin EC 81 MG tablet Take 81 mg by mouth daily.     carboxymethylcellulose (REFRESH PLUS) 0.5 % SOLN 1 drop 3 (three) times daily as needed.     Cholecalciferol 25 MCG (1000 UT) tablet Take 1,000 Units by mouth daily.      loratadine (CLARITIN) 10 MG tablet Take 10 mg by mouth daily.     Magnesium Citrate 100 MG TABS Take by mouth.     Multiple Vitamins-Minerals (CENTRUM ADULTS PO) Take by mouth.     SUMAtriptan (IMITREX) 50 MG tablet TAKE 1 TABLET BY MOUTH AS NEEDED FOR MIGRAINE  2   tamoxifen (NOLVADEX) 20 MG tablet Take 20 mg by mouth daily.     venlafaxine XR (EFFEXOR-XR) 75 MG 24 hr capsule Take 1 capsule (75 mg total) by mouth daily with breakfast. 90 capsule 2   vitamin B-12 (CYANOCOBALAMIN) 1000 MCG tablet Take 1,000 mcg by mouth daily.     No current facility-administered medications for this visit.  ALLERGIES: Codeine  Family History  Problem Relation Age of Onset   Cancer Mother        mouth   Early death Mother    Esophageal cancer Mother    Arthritis Father    Hypertension Father    Sleep apnea Father    Depression Sister    Breast cancer Sister    Cancer Sister    Arthritis Maternal Grandmother    Cancer Maternal Grandmother        septocemia   Arthritis Maternal Grandfather    COPD Maternal Grandfather    Lung cancer Maternal Grandfather        smoker   Stroke Maternal Grandfather    Arthritis Paternal Grandmother    Colon cancer Paternal Grandmother        9's   Alzheimer's disease Paternal Grandmother        ?   Arthritis Paternal Grandfather    Lung cancer Paternal Grandfather        abspetosis   Other Daughter        erythromelalgia   Rectal cancer Neg Hx    Stomach cancer Neg Hx     Social History   Socioeconomic History   Marital status: Married    Spouse name: Not on file   Number of children: 2   Years of education: Not on file   Highest education level: Not on file  Occupational History   Occupation: Sr Research scientist (physical sciences)  Tobacco Use   Smoking status: Never   Smokeless tobacco: Never   Tobacco comments:    Never smoked  Vaping Use   Vaping Use: Never used  Substance and Sexual Activity   Alcohol use: Never   Drug use: Never   Sexual activity: Yes    Partners: Male    Birth control/protection: Abstinence, Condom  Other Topics Concern   Not on file  Social History Narrative   Husband is a Theme park manager   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  PHYSICAL EXAMINATION:    BP (!) 150/62   Pulse 77   Ht 5' 7.5" (1.715 m)   Wt 219 lb (99.3 kg)   LMP 10/11/2016   SpO2 100%   BMI 33.79 kg/m     General appearance: alert, cooperative and appears stated age   Pelvic: External genitalia:  no lesions, no bumps seen, mild erythema              Urethra:  normal appearing urethra with no masses, tenderness or lesions              Bartholins and Skenes: normal                 Vagina: mildly erythematous appearing vagina with normal color and discharge, no lesions              Cervix: no lesions                Chaperone was present for exam.  1. Vulvovaginitis - WET PREP FOR TRICH, YEAST, CLUE: negative - betamethasone valerate ointment (VALISONE) 0.1 %; Apply a  pea sized amount topically BID for up to 2 weeks as needed  Dispense: 30 g; Refill: 0 -discussed vulvar skin care - SureSwab Advanced Vaginitis, TMA

## 2021-07-30 NOTE — Patient Instructions (Signed)

## 2021-08-01 LAB — SURESWAB® ADVANCED VAGINITIS,TMA
CANDIDA SPECIES: NOT DETECTED
Candida glabrata: NOT DETECTED
SURESWAB(R) ADV BACTERIAL VAGINOSIS(BV),TMA: NEGATIVE
TRICHOMONAS VAGINALIS (TV),TMA: NOT DETECTED

## 2021-10-08 NOTE — Progress Notes (Signed)
61 y.o. [id number] Married White or Caucasian Not Hispanic or Latino female here for annual exam.  No vaginal bleeding. No dyspareunia.    H/O  Breast cancer in her right breast in 2017. S/P lumpectomy, radiation and tamoxifen treatment (stopped in 12/22).   Some urinary hesitancy, no change. Feels she empties well.   Some issues with constipation, getting better.   Lab work in [place] in 12/22, hgbA1C was 6.8%. She has lost 10 lbs, will have f/u blood work in June in [place]. Last lipid panel was in 6/22 in [place], abnormal with low HDL, mildly elevated LDL, high triglycerides.   Patient's last menstrual period was 10/11/2016.          Sexually active: Yes.    The current method of family planning is post menopausal status.    Exercising: Yes.     Walking  Smoker:  no  Health Maintenance: Pap:   10/16/18 WNL HR HPV Neg  History of abnormal Pap:  no MMG:  02/28/21 Bi-rads 2 benign ( care everywhere )  BMD:   none  Colonoscopy: 06/15/20 f/u 10 years  TDaP:  02/18/17  Gardasil: n/a   reports that she has never smoked. She has never used smokeless tobacco. She reports that she does not drink alcohol and does not use drugs. 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 Husband is a Clinical biochemist. Son lives in [place] moving to [place], works in Engineer, technical sales. Daughter is 88, lives at home, has multiple health issues. Daughter has daily migraines, social anxiety, depression, erythromelalgia in her feet. Her feet are so painful she can barely walk.     Past Medical History:  Diagnosis Date   Abnormal cholesterol test    Allergies    Allergy 2014   Amenorrhea    Anxiety    B12 deficiency    Back pain    Breast cancer ([place]) 2017   s/p surgery and radiation   Cataract 2018   Constipation    Depression    Dysmenorrhea    Fatty liver    High blood pressure    pt denies   High blood sugar    High triglycerides    pt denies   Migraines    Pre-diabetes    Scheuermann's disease     Sleep apnea    pt stated she wa snever dx   Vitamin D deficiency     Past Surgical History:  Procedure Laterality Date   BREAST LUMPECTOMY Right 2017   GUM SURGERY     alloderm inplantation   WISDOM TOOTH EXTRACTION      Current Outpatient Medications  Medication Sig Dispense Refill   aspirin EC 81 MG tablet Take 81 mg by mouth daily.     betamethasone valerate ointment (VALISONE) 0.1 % Apply a pea sized amount topically BID for up to 2 weeks as needed 30 g 0   carboxymethylcellulose (REFRESH PLUS) 0.5 % SOLN 1 drop 3 (three) times daily as needed.     Cholecalciferol 25 MCG (1000 UT) tablet Take 1,000 Units by mouth daily.      loratadine (CLARITIN) 10 MG tablet Take 10 mg by mouth daily.     Magnesium Citrate 100 MG TABS Take by mouth.     Multiple Vitamins-Minerals (CENTRUM ADULTS PO) Take by mouth.     SUMAtriptan (IMITREX) 50 MG tablet TAKE 1 TABLET BY MOUTH AS NEEDED FOR MIGRAINE  2   venlafaxine XR (EFFEXOR-XR) 75 MG 24 hr capsule  Take 1 capsule (75 mg total) by mouth daily with breakfast. 90 capsule 2   vitamin B-12 (CYANOCOBALAMIN) 1000 MCG tablet Take 1,000 mcg by mouth daily.     No current facility-administered medications for this visit.    Family History  Problem Relation Age of Onset   Cancer Mother        mouth   Early death Mother    Esophageal cancer Mother    Arthritis Father    Hypertension Father    Sleep apnea Father    Depression Sister    Breast cancer Sister    Cancer Sister    Arthritis Maternal Grandmother    Cancer Maternal Grandmother        septocemia   Arthritis Maternal Grandfather    COPD Maternal Grandfather    Lung cancer Maternal Grandfather        smoker   Stroke Maternal Grandfather    Arthritis Paternal Grandmother    Colon cancer Paternal Grandmother        60's   Alzheimer's disease Paternal Grandmother        ?   Arthritis Paternal Grandfather    Lung cancer Paternal Grandfather        abspetosis   Other Daughter         erythromelalgia   Rectal cancer Neg Hx    Stomach cancer Neg Hx     Review of Systems  All other systems reviewed and are negative.  Exam:   BP 110/62 (BP Location: Right Arm, Patient Position: Sitting, Cuff Size: Large)    Pulse 95    Ht 5\' 8"  (1.727 m)    Wt 213 lb (96.6 kg)    LMP 10/11/2016    SpO2 98%    BMI 32.39 kg/m   Weight change: @WEIGHTCHANGE @ Height:   Height: 5\' 8"  (172.7 cm)  Ht Readings from Last 3 Encounters:  10/17/21 5\' 8"  (1.727 m)  07/30/21 5' 7.5" (1.715 m)  07/03/21 5' 7.5" (1.715 m)    General appearance: alert, cooperative and appears stated age Head: Normocephalic, without obvious abnormality, atraumatic Neck: no adenopathy, supple, symmetrical, trachea midline and thyroid normal to inspection and palpation Lungs: clear to auscultation bilaterally Cardiovascular: regular rate and rhythm Breasts: normal appearance, no masses or tenderness Abdomen: soft, non-tender; non distended,  no masses,  no organomegaly Extremities: extremities normal, atraumatic, no cyanosis or edema Skin: Skin color, texture, turgor normal. No rashes or lesions Lymph nodes: Cervical, supraclavicular, and axillary nodes normal. No abnormal inguinal nodes palpated Neurologic: Grossly normal   Pelvic: External genitalia:  no lesions              Urethra:  normal appearing urethra with no masses, tenderness or lesions              Bartholins and Skenes: normal                 Vagina: mildly atrophic appearing vagina with normal color and discharge, no lesions              Cervix: no lesions               Bimanual Exam:  Uterus:   no masses or tenderness              Adnexa: no mass, fullness, tenderness               Rectovaginal: Confirms  Anus:  normal sphincter tone, no lesions  Gae Dry chaperoned for the exam.  1. Well woman exam Discussed breast self exam Discussed calcium and vit D intake Mammogram due in 6/23 Colonoscopy UTD Labs with  Oncologist No pap this year  2. History of breast cancer Stable  3. History of diabetes mellitus She will f/u with Oncology/primary  4. Lipids abnormal Recommended she f/u with primary

## 2021-10-16 ENCOUNTER — Ambulatory Visit: Payer: 59 | Admitting: Physician Assistant

## 2021-10-17 ENCOUNTER — Ambulatory Visit (INDEPENDENT_AMBULATORY_CARE_PROVIDER_SITE_OTHER): Payer: 59 | Admitting: Obstetrics and Gynecology

## 2021-10-17 ENCOUNTER — Other Ambulatory Visit: Payer: Self-pay

## 2021-10-17 ENCOUNTER — Encounter: Payer: Self-pay | Admitting: Obstetrics and Gynecology

## 2021-10-17 VITALS — BP 110/62 | HR 95 | Ht 68.0 in | Wt 213.0 lb

## 2021-10-17 DIAGNOSIS — Z01419 Encounter for gynecological examination (general) (routine) without abnormal findings: Secondary | ICD-10-CM

## 2021-10-17 DIAGNOSIS — E7889 Other lipoprotein metabolism disorders: Secondary | ICD-10-CM | POA: Diagnosis not present

## 2021-10-17 DIAGNOSIS — Z8639 Personal history of other endocrine, nutritional and metabolic disease: Secondary | ICD-10-CM | POA: Diagnosis not present

## 2021-10-17 DIAGNOSIS — Z853 Personal history of malignant neoplasm of breast: Secondary | ICD-10-CM

## 2021-10-17 NOTE — Patient Instructions (Signed)

## 2022-03-28 ENCOUNTER — Telehealth: Payer: Self-pay

## 2022-03-28 NOTE — Telephone Encounter (Signed)
I called patient because we received inquiry by mail from [facility] that patient was due for mammo in 02/2022.  I called patient to see if she had it scheduled/remind her. She said she saw her Oncologist in [place] in June and had her mammogram done there and it was negative. She said she is very diligent about this.  She said Dr. Talbert Nan should be able to view this in My Chart because that Dr. Could see her chart from our office.

## 2022-04-17 ENCOUNTER — Encounter (INDEPENDENT_AMBULATORY_CARE_PROVIDER_SITE_OTHER): Payer: Self-pay

## 2022-06-03 ENCOUNTER — Encounter: Payer: Self-pay | Admitting: *Deleted

## 2022-07-04 ENCOUNTER — Ambulatory Visit (INDEPENDENT_AMBULATORY_CARE_PROVIDER_SITE_OTHER): Payer: 59 | Admitting: Physician Assistant

## 2022-07-04 ENCOUNTER — Encounter: Payer: Self-pay | Admitting: Physician Assistant

## 2022-07-04 VITALS — BP 130/80 | HR 57 | Temp 97.7°F | Ht 67.0 in | Wt 208.4 lb

## 2022-07-04 DIAGNOSIS — Z114 Encounter for screening for human immunodeficiency virus [HIV]: Secondary | ICD-10-CM | POA: Diagnosis not present

## 2022-07-04 DIAGNOSIS — E559 Vitamin D deficiency, unspecified: Secondary | ICD-10-CM

## 2022-07-04 DIAGNOSIS — R7303 Prediabetes: Secondary | ICD-10-CM

## 2022-07-04 DIAGNOSIS — M21611 Bunion of right foot: Secondary | ICD-10-CM

## 2022-07-04 DIAGNOSIS — E669 Obesity, unspecified: Secondary | ICD-10-CM | POA: Diagnosis not present

## 2022-07-04 DIAGNOSIS — Z853 Personal history of malignant neoplasm of breast: Secondary | ICD-10-CM

## 2022-07-04 DIAGNOSIS — Z0000 Encounter for general adult medical examination without abnormal findings: Secondary | ICD-10-CM

## 2022-07-04 DIAGNOSIS — L309 Dermatitis, unspecified: Secondary | ICD-10-CM

## 2022-07-04 DIAGNOSIS — E782 Mixed hyperlipidemia: Secondary | ICD-10-CM

## 2022-07-04 LAB — CBC WITH DIFFERENTIAL/PLATELET
Basophils Absolute: 0 10*3/uL (ref 0.0–0.1)
Basophils Relative: 0.5 % (ref 0.0–3.0)
Eosinophils Absolute: 0.2 10*3/uL (ref 0.0–0.7)
Eosinophils Relative: 2.8 % (ref 0.0–5.0)
HCT: 42 % (ref 36.0–46.0)
Hemoglobin: 13.9 g/dL (ref 12.0–15.0)
Lymphocytes Relative: 37.2 % (ref 12.0–46.0)
Lymphs Abs: 3 10*3/uL (ref 0.7–4.0)
MCHC: 33.1 g/dL (ref 30.0–36.0)
MCV: 91.3 fl (ref 78.0–100.0)
Monocytes Absolute: 0.5 10*3/uL (ref 0.1–1.0)
Monocytes Relative: 6.5 % (ref 3.0–12.0)
Neutro Abs: 4.3 10*3/uL (ref 1.4–7.7)
Neutrophils Relative %: 53 % (ref 43.0–77.0)
Platelets: 245 10*3/uL (ref 150.0–400.0)
RBC: 4.6 Mil/uL (ref 3.87–5.11)
RDW: 14.1 % (ref 11.5–15.5)
WBC: 8.1 10*3/uL (ref 4.0–10.5)

## 2022-07-04 LAB — COMPREHENSIVE METABOLIC PANEL
ALT: 14 U/L (ref 0–35)
AST: 15 U/L (ref 0–37)
Albumin: 4.3 g/dL (ref 3.5–5.2)
Alkaline Phosphatase: 64 U/L (ref 39–117)
BUN: 14 mg/dL (ref 6–23)
CO2: 27 mEq/L (ref 19–32)
Calcium: 9.4 mg/dL (ref 8.4–10.5)
Chloride: 104 mEq/L (ref 96–112)
Creatinine, Ser: 0.8 mg/dL (ref 0.40–1.20)
GFR: 80.52 mL/min (ref >60.00)
Glucose, Bld: 118 mg/dL — ABNORMAL HIGH (ref 70–99)
Potassium: 3.9 mEq/L (ref 3.5–5.1)
Sodium: 139 mEq/L (ref 135–145)
Total Bilirubin: 0.8 mg/dL (ref 0.2–1.2)
Total Protein: 7.2 g/dL (ref 6.0–8.3)

## 2022-07-04 LAB — VITAMIN D 25 HYDROXY (VIT D DEFICIENCY, FRACTURES): VITD: 30.21 ng/mL (ref 30.00–100.00)

## 2022-07-04 LAB — LIPID PANEL
Cholesterol: 235 mg/dL — ABNORMAL HIGH (ref 0–200)
HDL: 56.1 mg/dL (ref >39.00)
LDL Cholesterol: 147 mg/dL — ABNORMAL HIGH (ref 0–99)
NonHDL: 179.31
Total CHOL/HDL Ratio: 4
Triglycerides: 160 mg/dL — ABNORMAL HIGH (ref 0.0–149.0)
VLDL: 32 mg/dL (ref 0.0–40.0)

## 2022-07-04 NOTE — Patient Instructions (Addendum)
It was great to see you!  Referral to podiatry and dermatology.  Please go to the lab for blood work.   Our office will call you with your results unless you have chosen to receive results via MyChart.  If your blood work is normal we will follow-up each year for physicals and as scheduled for chronic medical problems.  If anything is abnormal we will treat accordingly and get you in for a follow-up.  Take care,  Aldona Bar

## 2022-07-04 NOTE — Progress Notes (Signed)
Subjective:    Alexandra Fitzpatrick is a 61 y.o. female and is here for a comprehensive physical exam.  HPI   There are no preventive care reminders to display for this patient.   Acute Concerns: Bunion -- She reports she has a bunion on her right foot. This has gotten worse because she has been walking more than usual. She also had blister on her big right toe, which was due to hiking in [place]. She just returned from [place] last Wednesday.   Eczema: She reports she has eczema and has developed some rash on her skin. She is interested in a Dermatologist referral.  Prediabetes: She reports her A1c was been greater than 6.5 when she had blood work done at her oncology appointment in June 2023.   Sleep Apnea: She reports she has been diagnosed with sleep apnea and has a mouth guard. She notes her mouth guard broke recently and has noticed that she has been waking up more frequently.   Chronic Issues: Hx of breast cancer: She reports she has been going to [place] for oncology and notes that her vitamin-D was high.   Obesity: She complains of struggling losing weight. She has been eating healthy food. However, she has lost weight from our previous visit. Wt Readings from Last 3 Encounters:  07/04/22 208 lb 6.1 oz (94.5 kg)  10/17/21 213 lb (96.6 kg)  07/30/21 219 lb (99.3 kg)   Anxiety/depression: She feels stable with Effexor 75 mg, but still complains of having a hard time waking up in the morning.   Health Maintenance: Social History -- She reports she has 3 sisters and one has breast cancer and one has depression. Her father is living and has arthritis, htn, and sleep apnea.  Immunizations --  Colonoscopy -- Last completed on 06/25/2020. Mammogram -- Last completed on 02/28/2021. PAP -- Last completed in February 2023.  Bone Density -- Last completed on  02/29/2020 with normal results. Diet -- She maintains a healthy diet.  Exercise -- She does fair amount of  exercise.  Sleep habits -- She has been sleeping good, but she has hard time waking up in the morning.  Mood -- Stable mood.  UTD with dentist? - She is UTD with dentist. UTD with eye doctor? - She is UTD with eye doctor.  Weight history: Wt Readings from Last 10 Encounters:  07/04/22 208 lb 6.1 oz (94.5 kg)  10/17/21 213 lb (96.6 kg)  07/30/21 219 lb (99.3 kg)  07/03/21 216 lb 8 oz (98.2 kg)  02/01/21 210 lb (95.3 kg)  01/29/21 211 lb (95.7 kg)  12/25/20 214 lb (97.1 kg)  09/14/20 211 lb (95.7 kg)  08/15/20 211 lb (95.7 kg)  07/11/20 211 lb (95.7 kg)   Body mass index is 32.64 kg/m. Patient's last menstrual period was 10/11/2016.  Alcohol use:  reports no history of alcohol use.  Tobacco use:  Tobacco Use: Low Risk  (07/04/2022)   Patient History    Smoking Tobacco Use: Never    Smokeless Tobacco Use: Never    Passive Exposure: Not on file   Eligible for lung cancer screening? No.     07/04/2022    8:06 AM  Depression screen PHQ 2/9  Decreased Interest 0  Down, Depressed, Hopeless 0  PHQ - 2 Score 0  Altered sleeping 0  Tired, decreased energy 1  Change in appetite 0  Feeling bad or failure about yourself  0  Trouble concentrating 0  Moving slowly or  fidgety/restless 0  Suicidal thoughts 0  PHQ-9 Score 1  Difficult doing work/chores Not difficult at all     Other providers/specialists: Patient Care Team: [name], [place] as PCP - General (Physician Assistant) Warren Danes, PA-C as Physician Assistant (Dermatology)    PMHx, SurgHx, SocialHx, Medications, and Allergies were reviewed in the Visit Navigator and updated as appropriate.   Past Medical History:  Diagnosis Date   Abnormal cholesterol test    Allergies    Allergy 2014   Amenorrhea    Anxiety    B12 deficiency    Back pain    Breast cancer ([place]) 2017   s/p surgery and radiation   Cataract 2018   Constipation    Depression    Dysmenorrhea    Fatty liver    High blood  pressure    pt denies   High blood sugar    High triglycerides    pt denies   Migraines    Pre-diabetes    Scheuermann's disease    Sleep apnea    pt stated she wa snever dx   Vitamin D deficiency      Past Surgical History:  Procedure Laterality Date   BREAST LUMPECTOMY Right 2017   GUM SURGERY     alloderm inplantation   WISDOM TOOTH EXTRACTION       Family History  Problem Relation Age of Onset   Cancer Mother        mouth   Early death Mother    Esophageal cancer Mother    Arthritis Father    Hypertension Father    Sleep apnea Father    Depression Sister    Breast cancer Sister    Arthritis Maternal Grandmother    Cancer Maternal Grandmother        septocemia   Arthritis Maternal Grandfather    COPD Maternal Grandfather    Lung cancer Maternal Grandfather        smoker   Stroke Maternal Grandfather    Arthritis Paternal Grandmother    Colon cancer Paternal Grandmother        76's   Alzheimer's disease Paternal Grandmother        ?   Arthritis Paternal Grandfather    Lung cancer Paternal Grandfather        abspetosis   Other Daughter        erythromelalgia   Rectal cancer Neg Hx    Stomach cancer Neg Hx     Social History   Tobacco Use   Smoking status: Never   Smokeless tobacco: Never   Tobacco comments:    Never smoked  Vaping Use   Vaping Use: Never used  Substance Use Topics   Alcohol use: Never   Drug use: Never    Review of Systems:   Review of Systems  Constitutional:  Negative for chills, fever, malaise/fatigue and weight loss.  HENT:  Negative for hearing loss, sinus pain and sore throat.   Respiratory:  Negative for cough and hemoptysis.   Cardiovascular:  Negative for chest pain, palpitations, orthopnea, leg swelling and PND.  Gastrointestinal:  Negative for abdominal pain, constipation, diarrhea, heartburn, nausea and vomiting.  Genitourinary:  Negative for dysuria, frequency and urgency.  Musculoskeletal:  Negative for  back pain, myalgias and neck pain.  Skin:  Negative for itching and rash.  Endo/Heme/Allergies:  Negative for polydipsia.  Psychiatric/Behavioral:  Negative for depression. The patient is not nervous/anxious.     Objective:   BP 130/80 (BP Location:  Left Arm, Patient Position: Sitting, Cuff Size: Normal)   Pulse (!) 57   Temp 97.7 F (36.5 C) (Temporal)   Ht '5\' 7"'$  (1.702 m)   Wt 208 lb 6.1 oz (94.5 kg)   LMP 10/11/2016   SpO2 97%   BMI 32.64 kg/m  Body mass index is 32.64 kg/m.   General Appearance:    Alert, cooperative, no distress, appears stated age  Head:    Normocephalic, without obvious abnormality, atraumatic  Eyes:    PERRL, conjunctiva/corneas clear, EOM's intact, fundi    benign, both eyes  Ears:    Normal TM's and external ear canals, both ears  Nose:   Nares normal, septum midline, mucosa normal, no drainage    or sinus tenderness  Throat:   Lips, mucosa, and tongue normal; teeth and gums normal  Neck:   Supple, symmetrical, trachea midline, no adenopathy;    thyroid:  no enlargement/tenderness/nodules; no carotid   bruit or JVD  Back:     Symmetric, no curvature, ROM normal, no CVA tenderness  Lungs:     Clear to auscultation bilaterally, respirations unlabored  Chest Wall:    No tenderness or deformity   Heart:    Regular rate and rhythm, S1 and S2 normal, no murmur, rub or gallop  Breast Exam:    Deferred   Abdomen:     Soft, non-tender, bowel sounds active all four quadrants,    no masses, no organomegaly  Genitalia:    Deferred   Extremities:   Extremities normal, atraumatic, no cyanosis or edema  Pulses:   2+ and symmetric all extremities  Skin:   Skin color, texture, turgor normal, no rashes or lesions  Lymph nodes:   Cervical, supraclavicular, and axillary nodes normal  Neurologic:   CNII-XII intact, normal strength, sensation and reflexes    throughout    Assessment/Plan:   Routine physical examination Today patient counseled on age appropriate  routine health concerns for screening and prevention, each reviewed and up to date or declined. Immunizations reviewed and up to date or declined. Labs ordered and reviewed. Risk factors for depression reviewed and negative. Hearing function and visual acuity are intact. ADLs screened and addressed as needed. Functional ability and level of safety reviewed and appropriate. Education, counseling and referrals performed based on assessed risks today. Patient provided with a copy of personalized plan for preventive services.  Obesity, unspecified classification, unspecified obesity type, unspecified whether serious comorbidity present Continue healthy diet and exercise  Screening for HIV (human immunodeficiency virus) Update HIV  Mixed hyperlipidemia Update lipid panel and make recommendations accordingly  Bunion, right Referral to podiatry  Vitamin D deficiency Update Vit D and make recommendations accordingly  Eczema, unspecified type Referral to dermatology  Prediabetes Consider Ozempic/Mounjaro -- she will research this She will have her A1c checked by oncology in [facility]  History of right breast cancer Management per onc  I,Param Shah,acting as a scribe for [name], PA.,have documented all relevant documentation on the behalf of [name], PA,as directed by  [name], PA while in the presence of [name], [place].  I, [name], [place], have reviewed all documentation for this visit. The documentation on 07/04/22 for the exam, diagnosis, procedures, and orders are all accurate and complete.  [name], PA-C [place]

## 2022-07-06 ENCOUNTER — Encounter: Payer: Self-pay | Admitting: Physician Assistant

## 2022-07-08 MED ORDER — VENLAFAXINE HCL ER 75 MG PO CP24: 75.0000 mg | ORAL_CAPSULE | Freq: Every day | ORAL | 1 refills | Status: DC

## 2022-07-18 ENCOUNTER — Encounter: Payer: Self-pay | Admitting: Podiatry

## 2022-07-18 ENCOUNTER — Ambulatory Visit (INDEPENDENT_AMBULATORY_CARE_PROVIDER_SITE_OTHER): Payer: 59

## 2022-07-18 ENCOUNTER — Ambulatory Visit: Payer: 59 | Admitting: Podiatry

## 2022-07-18 DIAGNOSIS — M7751 Other enthesopathy of right foot: Secondary | ICD-10-CM

## 2022-07-18 DIAGNOSIS — M2041 Other hammer toe(s) (acquired), right foot: Secondary | ICD-10-CM

## 2022-07-18 DIAGNOSIS — D2371 Other benign neoplasm of skin of right lower limb, including hip: Secondary | ICD-10-CM

## 2022-07-18 MED ORDER — TRIAMCINOLONE ACETONIDE 40 MG/ML IJ SUSP
20.0000 mg | Freq: Once | INTRAMUSCULAR | Status: AC
  Administered 2022-07-18: 20 mg

## 2022-07-21 NOTE — Patient Instructions (Signed)
]      ROS ] 

## 2022-07-21 NOTE — Progress Notes (Signed)
Subjective:  Patient ID: Alexandra Fitzpatrick, female    DOB: 10/16/1960,  MRN: [id number] HPI Chief Complaint  Patient presents with   Foot Pain    5th MPJ right - aching, swelling x few months, previous injury where she tore some tendons-had PT, which helped, but has trouble with shoe gear, callus sub 5th MPJ   Posterior heel right - noticing a bump, but not bothersome   New Patient (Initial Visit)    61 y.o. female presents with the above complaint.   ROS: Denies fever chills nausea vomiting muscle aches pains calf pain back pain chest pain shortness of breath.  Past Medical History:  Diagnosis Date   Abnormal cholesterol test    Allergies    Allergy 2014   Amenorrhea    Anxiety    B12 deficiency    Back pain    Breast cancer ([place]) 2017   s/p surgery and radiation   Cataract 2018   Constipation    Depression    Dysmenorrhea    Fatty liver    High blood pressure    pt denies   High blood sugar    High triglycerides    pt denies   Migraines    Pre-diabetes    Scheuermann's disease    Sleep apnea    pt stated she wa snever dx   Vitamin D deficiency    Past Surgical History:  Procedure Laterality Date   BREAST LUMPECTOMY Right 2017   GUM SURGERY     alloderm inplantation   WISDOM TOOTH EXTRACTION      Current Outpatient Medications:    carboxymethylcellulose (REFRESH PLUS) 0.5 % SOLN, 1 drop 3 (three) times daily as needed., Disp: , Rfl:    Cholecalciferol 25 MCG (1000 UT) tablet, Take 1,000 Units by mouth daily.  (Patient not taking: Reported on 07/04/2022), Disp: , Rfl:    loratadine (CLARITIN) 10 MG tablet, Take 10 mg by mouth daily. (Patient not taking: Reported on 07/04/2022), Disp: , Rfl:    Magnesium Citrate 100 MG TABS, Take by mouth. (Patient not taking: Reported on 07/04/2022), Disp: , Rfl:    Multiple Vitamins-Minerals (CENTRUM ADULTS PO), Take by mouth. (Patient not taking: Reported on 07/04/2022), Disp: , Rfl:    venlafaxine XR (EFFEXOR-XR) 75  MG 24 hr capsule, Take 1 capsule (75 mg total) by mouth daily with breakfast., Disp: 90 capsule, Rfl: 1   vitamin B-12 (CYANOCOBALAMIN) 1000 MCG tablet, Take 1,000 mcg by mouth daily. (Patient not taking: Reported on 07/04/2022), Disp: , Rfl:   Allergies  Allergen Reactions   Codeine Other (See Comments) and Shortness Of Breath   Review of Systems Objective:  There were no vitals filed for this visit.  General: Well developed, nourished, in no acute distress, alert and oriented x3   Dermatological: Skin is warm, dry and supple bilateral. Nails x 10 are well maintained; remaining integument appears unremarkable at this time. There are no open sores, no preulcerative lesions, no rash or signs of infection present.  Palpable soft tissue mass to the plantar lateral aspect of the fifth metatarsal of the right foot.  Could be fluctuant or possibly a fibrosed bursa.  Vascular: Dorsalis Pedis artery and Posterior Tibial artery pedal pulses are 2/4 bilateral with immedate capillary fill time. Pedal hair growth present. No varicosities and no lower extremity edema present bilateral.   Neruologic: Grossly intact via light touch bilateral. Vibratory intact via tuning fork bilateral. Protective threshold with Semmes Wienstein monofilament intact to all pedal  sites bilateral. Patellar and Achilles deep tendon reflexes 2+ bilateral. No Babinski or clonus noted bilateral.   Musculoskeletal: No gross boney pedal deformities bilateral. No pain, crepitus, or limitation noted with foot and ankle range of motion bilateral. Muscular strength 5/5 in all groups tested bilateral.  Tailor's bunion deformity is present with the underlying soft tissue mass.  Gait: Unassisted, Nonantalgic.    Radiographs: Radiographs taken today demonstrate a rectus foot type with a tailor's bunion form a lateral deviation of the neck of the fifth metatarsal of the right foot resulting in the tailor's bunion deformity.  Most likely this  is resulting in chronic irritation and thickening of the underlying soft tissues consistent with bursitis and fibrosis.    Assessment & Plan:   Assessment: Tailor's bunion deformity and bursitis fifth met head right  Plan: Consented her today for fifth metatarsal osteotomy and resection of soft tissue mass.  She understands this is amenable to it does understand the possible consequences associated with this may include but not limited to postop pain bleeding swell infection recurrence need for further surgery overcorrection under correction loss of digit loss limb loss of life.  Also injected the area today 20 mg Kenalog milligrams Marcaine point of maximal tenderness.     Rod Majerus T. [place], [place]

## 2022-08-20 LAB — HM DEXA SCAN

## 2022-09-26 ENCOUNTER — Telehealth: Payer: Self-pay | Admitting: Podiatry

## 2022-09-26 NOTE — Telephone Encounter (Addendum)
DOS: 10/25/2022  [employer] Effective 12/08/2021  Metatarsal Osteotomy 5th Rt (28786) Tailors Bunionectomy Rt [phone]) Exc. Soft Tissue Mass Rt [phone])  Deductible: $300 with $0 met. Out-of-Pocket: $4,000 with $0 met CoInsurance: 0%  Prior authorization is not required per Alyson Locket. Call Reference #: [id number]

## 2022-10-15 NOTE — Progress Notes (Signed)
62 y.o. G64P2002 Married White or Caucasian Not Hispanic or Latino female here for annual exam.  Had 1 episode x's 2 days of vaginal brown discharge 2 months ago, noticed with wiping.  No other bleeding.  H/O  Breast cancer in her right breast in 2017. S/P lumpectomy, radiation and tamoxifen treatment (stopped in 12/22). Followed in [place].    H/O DM, last HgbA1C was 7.1% in 12/23  Having foot surgery on Friday. Will be unable to drive for 6 weeks.   Patient's last menstrual period was 10/11/2016.          Sexually active: No.  The current method of family planning is post menopausal Exercising: Yes.    Walking Smoker:  no  Health Maintenance: Pap:     10/16/18 WNL HR HPV Neg   History of abnormal Pap:  no MMG:  02/26/22 bi-rads 2 benign Care everywhere  BMD:   08/20/22 normal Care everywhere  Colonoscopy: 06/15/20 f/u 10 years  TDaP:  02/18/17 Gardasil: n/a    reports that she has never smoked. She has never used smokeless tobacco. She reports that she does not drink alcohol and does not use drugs.  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 Husband is a Clinical biochemist. Daughter is 69, lives at home, has multiple medical issues. Son is 44, lives in [place]. No grandchildren.   Past Medical History:  Diagnosis Date   Abnormal cholesterol test    Allergies    Allergy 2014   Amenorrhea    Anxiety    B12 deficiency    Back pain    Breast cancer ([place]) 2017   s/p surgery and radiation   Cataract 2018   Constipation    Depression    Dysmenorrhea    Fatty liver    High blood pressure    pt denies   High blood sugar    High triglycerides    pt denies   Migraines    Pre-diabetes    Scheuermann's disease    Sleep apnea    pt stated she wa snever dx   Vitamin D deficiency     Past Surgical History:  Procedure Laterality Date   BREAST LUMPECTOMY Right 2017   GUM SURGERY     alloderm inplantation   WISDOM TOOTH EXTRACTION      Current Outpatient  Medications  Medication Sig Dispense Refill   carboxymethylcellulose (REFRESH PLUS) 0.5 % SOLN 1 drop 3 (three) times daily as needed.     Cholecalciferol 25 MCG (1000 UT) tablet Take 1,000 Units by mouth daily.  (Patient not taking: Reported on 07/04/2022)     loratadine (CLARITIN) 10 MG tablet Take 10 mg by mouth daily. (Patient not taking: Reported on 07/04/2022)     Magnesium Citrate 100 MG TABS Take by mouth. (Patient not taking: Reported on 07/04/2022)     Multiple Vitamins-Minerals (CENTRUM ADULTS PO) Take by mouth. (Patient not taking: Reported on 07/04/2022)     venlafaxine XR (EFFEXOR-XR) 75 MG 24 hr capsule Take 1 capsule (75 mg total) by mouth daily with breakfast. 90 capsule 1   vitamin B-12 (CYANOCOBALAMIN) 1000 MCG tablet Take 1,000 mcg by mouth daily. (Patient not taking: Reported on 07/04/2022)     No current facility-administered medications for this visit.    Family History  Problem Relation Age of Onset   Cancer Mother        mouth   Early death Mother    Esophageal cancer Mother  Arthritis Father    Hypertension Father    Sleep apnea Father    Depression Sister    Breast cancer Sister    Arthritis Maternal Grandmother    Cancer Maternal Grandmother        septocemia   Arthritis Maternal Grandfather    COPD Maternal Grandfather    Lung cancer Maternal Grandfather        smoker   Stroke Maternal Grandfather    Arthritis Paternal Grandmother    Colon cancer Paternal Grandmother        77's   Alzheimer's disease Paternal Grandmother        ?   Arthritis Paternal Grandfather    Lung cancer Paternal Grandfather        abspetosis   Other Daughter        erythromelalgia   Rectal cancer Neg Hx    Stomach cancer Neg Hx     Review of Systems  All other systems reviewed and are negative.   Exam:   LMP 10/11/2016   Weight change: @WEIGHTCHANGE$ @ Height:      Ht Readings from Last 3 Encounters:  07/04/22 5' 7"$  (1.702 m)  10/17/21 5' 8"$  (1.727 m)   07/30/21 5' 7.5" (1.715 m)    General appearance: alert, cooperative and appears stated age Head: Normocephalic, without obvious abnormality, atraumatic Neck: no adenopathy, supple, symmetrical, trachea midline and thyroid normal to inspection and palpation Lungs: clear to auscultation bilaterally Cardiovascular: regular rate and rhythm Breasts: normal appearance, no masses or tenderness, evidence of right lumpectomy Abdomen: soft, non-tender; non distended,  no masses,  no organomegaly Extremities: extremities normal, atraumatic, no cyanosis or edema Skin: Skin color, texture, turgor normal. No rashes or lesions Lymph nodes: Cervical, supraclavicular, and axillary nodes normal. No abnormal inguinal nodes palpated Neurologic: Grossly normal   Pelvic: External genitalia:  no lesions              Urethra:  normal appearing urethra with no masses, tenderness or lesions              Bartholins and Skenes: normal                 Vagina: normal appearing vagina with normal color and discharge, no lesions              Cervix: no lesions               Bimanual Exam:  Uterus:  normal size, contour, position, consistency, mobility, non-tender              Adnexa: no mass, fullness, tenderness               Rectovaginal: Confirms               Anus:  normal sphincter tone, no lesions  Santiago Glad, CMA chaperoned for the exam.  1. Well woman exam Discussed breast self exam Discussed calcium and vit D intake Mammogram due in 6/24 Colonoscopy UTD Labs with primary  2. Postmenopausal bleeding Episode of spotting 2 months ago. H/O breast cancer and tamoxifen treatment. - US PELVIS TRANSVAGINAL NON-OB (TV ONLY); Future   3. Screening for cervical cancer - Cytology - PAP  4. History of breast cancer See above

## 2022-10-22 ENCOUNTER — Other Ambulatory Visit: Payer: 59

## 2022-10-22 ENCOUNTER — Ambulatory Visit (INDEPENDENT_AMBULATORY_CARE_PROVIDER_SITE_OTHER): Payer: 59 | Admitting: Obstetrics and Gynecology

## 2022-10-22 ENCOUNTER — Other Ambulatory Visit (HOSPITAL_COMMUNITY)
Admission: RE | Admit: 2022-10-22 | Discharge: 2022-10-22 | Disposition: A | Discharge status: Home / Self Care | Payer: 59 | Source: Ambulatory Visit | Attending: Obstetrics and Gynecology | Admitting: Obstetrics and Gynecology

## 2022-10-22 ENCOUNTER — Encounter: Payer: Self-pay | Admitting: Obstetrics and Gynecology

## 2022-10-22 VITALS — BP 124/68 | Ht 66.5 in | Wt 215.0 lb

## 2022-10-22 DIAGNOSIS — Z124 Encounter for screening for malignant neoplasm of cervix: Secondary | ICD-10-CM | POA: Diagnosis present

## 2022-10-22 DIAGNOSIS — N950 Postmenopausal bleeding: Secondary | ICD-10-CM | POA: Diagnosis not present

## 2022-10-22 DIAGNOSIS — Z01419 Encounter for gynecological examination (general) (routine) without abnormal findings: Secondary | ICD-10-CM | POA: Diagnosis not present

## 2022-10-22 DIAGNOSIS — Z853 Personal history of malignant neoplasm of breast: Secondary | ICD-10-CM | POA: Diagnosis not present

## 2022-10-22 NOTE — Patient Instructions (Signed)

## 2022-10-23 ENCOUNTER — Other Ambulatory Visit: Payer: Self-pay | Admitting: Podiatry

## 2022-10-23 MED ORDER — HYDROCODONE-ACETAMINOPHEN 10-325 MG PO TABS: 1.0000 | ORAL_TABLET | Freq: Four times a day (QID) | ORAL | 0 refills | Status: AC | PRN

## 2022-10-23 MED ORDER — CEPHALEXIN 500 MG PO CAPS: 500.0000 mg | ORAL_CAPSULE | Freq: Three times a day (TID) | ORAL | 0 refills | Status: DC

## 2022-10-23 MED ORDER — ONDANSETRON HCL 4 MG PO TABS: 4.0000 mg | ORAL_TABLET | Freq: Three times a day (TID) | ORAL | 0 refills | Status: DC | PRN

## 2022-10-24 ENCOUNTER — Ambulatory Visit (INDEPENDENT_AMBULATORY_CARE_PROVIDER_SITE_OTHER): Payer: 59

## 2022-10-24 ENCOUNTER — Other Ambulatory Visit (HOSPITAL_COMMUNITY)
Admission: RE | Admit: 2022-10-24 | Discharge: 2022-10-24 | Disposition: A | Discharge status: Home / Self Care | Payer: 59 | Source: Ambulatory Visit | Attending: Obstetrics and Gynecology | Admitting: Obstetrics and Gynecology

## 2022-10-24 ENCOUNTER — Ambulatory Visit (INDEPENDENT_AMBULATORY_CARE_PROVIDER_SITE_OTHER): Payer: 59 | Admitting: Obstetrics and Gynecology

## 2022-10-24 ENCOUNTER — Encounter: Payer: Self-pay | Admitting: Obstetrics and Gynecology

## 2022-10-24 VITALS — BP 120/74 | HR 88 | Ht 67.75 in | Wt 215.0 lb

## 2022-10-24 DIAGNOSIS — N950 Postmenopausal bleeding: Secondary | ICD-10-CM

## 2022-10-24 LAB — CYTOLOGY - PAP
Comment: NEGATIVE
Diagnosis: NEGATIVE
High risk HPV: NEGATIVE

## 2022-10-24 NOTE — Patient Instructions (Signed)

## 2022-10-24 NOTE — Progress Notes (Signed)
GYNECOLOGY  VISIT   HPI: 62 y.o.   Married White or Caucasian Not Hispanic or Latino  female   [phone] with Patient's last menstrual period was 10/11/2016.   here for further evaluation of PMP bleeding.    GYNECOLOGIC HISTORY: Patient's last menstrual period was 10/11/2016. Contraception:pmp  Menopausal hormone therapy: none         OB History     Gravida  2   Para  2   Term  2   Preterm  0   AB  0   Living  2      SAB  0   IAB  0   Ectopic  0   Multiple  0   Live Births  2              Patient Active Problem List   Diagnosis Date Noted   Colitis 04/12/2020   Family history of colon cancer 04/12/2020   Migraine 02/22/2020   Use of tamoxifen (Nolvadex) 02/22/2020   Obesity 02/22/2020   Prediabetes 06/29/2018   MDD (major depressive disorder), recurrent, in full remission ([place]) 02/18/2017   Breast cancer of lower-outer quadrant of right female breast ([place]) 01/30/2016   KCS (keratoconjunctivitis sicca) ([place]) 05/10/2014   Myopic astigmatism of both eyes 05/10/2014    Past Medical History:  Diagnosis Date   Abnormal cholesterol test    Allergies    Allergy 2014   Amenorrhea    Anxiety    B12 deficiency    Back pain    Breast cancer ([place]) 2017   s/p surgery and radiation   Cataract 2018   Constipation    Depression    Dysmenorrhea    Fatty liver    High blood pressure    pt denies   High blood sugar    High triglycerides    pt denies   Migraines    Pre-diabetes    Scheuermann's disease    Sleep apnea    pt stated she wa snever dx   Vitamin D deficiency     Past Surgical History:  Procedure Laterality Date   BREAST LUMPECTOMY Right 2017   GUM SURGERY     alloderm inplantation   WISDOM TOOTH EXTRACTION      Current Outpatient Medications  Medication Sig Dispense Refill   carboxymethylcellulose (REFRESH PLUS) 0.5 % SOLN 1 drop 3 (three) times daily as needed.     cephALEXin (KEFLEX) 500 MG capsule Take 1 capsule (500 mg total) by  mouth 3 (three) times daily. 30 capsule 0   Cholecalciferol 25 MCG (1000 UT) tablet Take 1,000 Units by mouth daily.  (Patient not taking: Reported on 07/04/2022)     clobetasol (TEMOVATE) 0.05 % external solution  (Patient not taking: Reported on 10/22/2022)     HYDROcodone-acetaminophen (NORCO) 10-325 MG tablet Take 1 tablet by mouth every 6 (six) hours as needed for up to 7 days. 28 tablet 0   loratadine (CLARITIN) 10 MG tablet Take 10 mg by mouth daily. (Patient not taking: Reported on 07/04/2022)     Magnesium Citrate 100 MG TABS Take by mouth. (Patient not taking: Reported on 07/04/2022)     Multiple Vitamins-Minerals (CENTRUM ADULTS PO) Take by mouth. (Patient not taking: Reported on 07/04/2022)     ondansetron (ZOFRAN) 4 MG tablet Take 1 tablet (4 mg total) by mouth every 8 (eight) hours as needed. 20 tablet 0   venlafaxine XR (EFFEXOR-XR) 75 MG 24 hr capsule Take 1 capsule (75 mg total) by  mouth daily with breakfast. 90 capsule 1   vitamin B-12 (CYANOCOBALAMIN) 1000 MCG tablet Take 1,000 mcg by mouth daily. (Patient not taking: Reported on 07/04/2022)     No current facility-administered medications for this visit.     ALLERGIES: Codeine  Family History  Problem Relation Age of Onset   Cancer Mother        mouth   Early death Mother    Esophageal cancer Mother    Arthritis Father    Hypertension Father    Sleep apnea Father    Depression Sister    Breast cancer Sister    Arthritis Maternal Grandmother    Cancer Maternal Grandmother        septocemia   Arthritis Maternal Grandfather    COPD Maternal Grandfather    Lung cancer Maternal Grandfather        smoker   Stroke Maternal Grandfather    Arthritis Paternal Grandmother    Colon cancer Paternal Grandmother        68's   Alzheimer's disease Paternal Grandmother        ?   Arthritis Paternal Grandfather    Lung cancer Paternal Grandfather        abspetosis   Other Daughter        erythromelalgia   Rectal cancer  Neg Hx    Stomach cancer Neg Hx     Social History   Socioeconomic History   Marital status: Married    Spouse name: Not on file   Number of children: 2   Years of education: Not on file   Highest education level: Not on file  Occupational History   Occupation: Sr Mudlogger  Tobacco Use   Smoking status: Never    Passive exposure: Never   Smokeless tobacco: Never   Tobacco comments:    Never smoked  Vaping Use   Vaping Use: Never used  Substance and Sexual Activity   Alcohol use: Never   Drug use: Never   Sexual activity: Yes    Partners: Male    Birth control/protection: Post-menopausal  Other Topics Concern   Not on file  Social History Narrative   Husband is a Theme park manager   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  Ultrasound from earlier today:  Pelvic ultrasound  Indications: PMP bleeding  Findings:  Uterus 8.09 x 4.62 x 3.86 cm, anteverted  Endometrium 6.69 mm, no obvious masses, avascular  Left ovary 1.74 x 1.12 x 1.18 cm  Right ovary 1.74 x 1.40 x 1.28 cm  No free fluid  Impression:  Normal sized anteverted uterus Endometrial stripe 6.7 mm, uniform, no masses noted Normal atrophic appearing ovaries bilaterally   PHYSICAL EXAMINATION:    BP 120/74   Pulse 88   Ht 5' 7.75" (1.721 m)   Wt 215 lb (97.5 kg)   LMP 10/11/2016   SpO2 100%   BMI 32.93 kg/m     General appearance: alert, cooperative and appears stated age  Pelvic: External genitalia:  no lesions              Urethra:  normal  appearing urethra with no masses, tenderness or lesions              Bartholins and Skenes: normal                  Vagina: normal appearing vagina with normal color and discharge, no lesions              Cervix: no lesions              The risks of endometrial biopsy were reviewed and a consent was obtained.  A speculum was placed in the vagina and the cervix was cleansed with betadine. The pipelle was placed into the endometrial cavity. The uterus sounded to 8 cm. The endometrial biopsy was performed, taking care to get a representative sample, sampling 360 degrees of the uterine cavity. Minimal tissue was obtained. The tenaculum and speculum were removed. There were no complications.    Chaperone was present for exam.  1. Postmenopausal bleeding Thickened endometrium - Surgical pathology( [facility])

## 2022-10-25 ENCOUNTER — Encounter: Payer: Self-pay | Admitting: *Deleted

## 2022-10-25 DIAGNOSIS — D2371 Other benign neoplasm of skin of right lower limb, including hip: Secondary | ICD-10-CM

## 2022-10-25 DIAGNOSIS — M2011 Hallux valgus (acquired), right foot: Secondary | ICD-10-CM

## 2022-10-28 LAB — SURGICAL PATHOLOGY

## 2022-10-29 ENCOUNTER — Other Ambulatory Visit: Payer: Self-pay | Admitting: *Deleted

## 2022-10-29 DIAGNOSIS — N950 Postmenopausal bleeding: Secondary | ICD-10-CM

## 2022-10-31 ENCOUNTER — Ambulatory Visit (INDEPENDENT_AMBULATORY_CARE_PROVIDER_SITE_OTHER): Payer: 59 | Admitting: Podiatry

## 2022-10-31 ENCOUNTER — Ambulatory Visit (INDEPENDENT_AMBULATORY_CARE_PROVIDER_SITE_OTHER): Payer: 59

## 2022-10-31 DIAGNOSIS — M21621 Bunionette of right foot: Secondary | ICD-10-CM

## 2022-10-31 NOTE — Progress Notes (Signed)
  Subjective:  Patient ID: Alexandra Fitzpatrick, female    DOB: Feb 11, 1961,  MRN: [id number]  Chief Complaint  Patient presents with   Routine Post Op    Patient states overall that she is feeling ok. She states that she has minimal pain. She states that she has taken very little pain medication.    DOS: 10/25/22 Procedure: Right foot tailor's bunionectomy with distal fifth metatarsal osteotomy and resection of soft tissue mass  62 y.o. female returns for post-op check.  Patient reports she has been doing well since surgery.  She has kept the dressing clean dry and intact as instructed.  She has done a little weightbearing to the heel only in the cam boot with the assist of a cane.  Denies any complaints or concerns.  Has been aggressively elevating and icing the right lower extremity to help with edema control.  Review of Systems: Negative except as noted in the HPI. Denies N/V/F/Ch.   Objective:  There were no vitals filed for this visit. There is no height or weight on file to calculate BMI. Constitutional Well developed. Well nourished.  Vascular Foot warm and well perfused. Capillary refill normal to all digits.  Calf is soft and supple, no posterior calf or knee pain, negative Homans' sign  Neurologic Normal speech. Oriented to person, place, and time. Epicritic sensation to light touch grossly present bilaterally.  Dermatologic Skin healing well without signs of infection. Skin edges well coapted without signs of infection.  Orthopedic: Mild tenderness to palpation noted about the surgical site.   Multiple view plain film radiographs: 10/31/2022 XR 3 views AP lateral oblique taken of the right foot.  Findings: Attention directed the fifth metatarsal there is noted to be in the osteotomy of the fifth metatarsal neck area with medial displacement of the fifth metatarsal head fixated with 2 surgical screws.  There is no evidence of displacement or complication at the osteotomy site at  this time. Assessment:   1. Tailor's bunion of right foot    Plan:  Patient was evaluated and treated and all questions answered.  S/p foot surgery right tailor's bunionectomy -Progressing as expected post-operatively.  Patient doing well no concerns at this time -XR: As above no immediate complication noted -WB Status: Minimal heel weightbearing as tolerated in cam boot -Sutures: Absorbable sutures to remain intact -Medications: Pain medications as needed -Foot redressed.  Patient advised to keep the dressing clean dry and intact until her next visit in 1 week.  Return in about 1 week (around 11/07/2022).         Everitt Amber, DPM [facility]

## 2022-11-07 ENCOUNTER — Ambulatory Visit (INDEPENDENT_AMBULATORY_CARE_PROVIDER_SITE_OTHER): Payer: 59 | Admitting: Podiatry

## 2022-11-07 DIAGNOSIS — M7751 Other enthesopathy of right foot: Secondary | ICD-10-CM

## 2022-11-07 DIAGNOSIS — M21621 Bunionette of right foot: Secondary | ICD-10-CM

## 2022-11-07 DIAGNOSIS — D2371 Other benign neoplasm of skin of right lower limb, including hip: Secondary | ICD-10-CM

## 2022-11-07 NOTE — Progress Notes (Signed)
  Subjective:  Patient ID: Alexandra Fitzpatrick, female    DOB: [birth year],  MRN: [id number]  Chief Complaint  Patient presents with   Follow-up    Patient states that she has had a little pain. Foot has been falling asleep with and without the boot while her foot is elevated across the top of her foot and between the great toe and the second toe.  No f/c/n/v    DOS: 10/25/22 Procedure: Right foot tailor's bunionectomy with distal fifth metatarsal osteotomy and resection of soft tissue mass  62 y.o. female returns for second postop check now 2 weeks past right foot fifth metatarsal osteotomy for tailor's bunion correction as well as soft tissue mass removal.  She reports she is doing very well pain has decreased as well as the swelling.  She has kept the dressing clean dry and intact as instructed.  She has been heel weightbearing in the cam boot as instructed.  Review of Systems: Negative except as noted in the HPI. Denies N/V/F/Ch.   Objective:  There were no vitals filed for this visit. There is no height or weight on file to calculate BMI. Constitutional Well developed. Well nourished.  Vascular Foot warm and well perfused. Capillary refill normal to all digits.  Calf is soft and supple, no posterior calf or knee pain, negative Homans' sign  Neurologic Normal speech. Oriented to person, place, and time. Epicritic sensation to light touch grossly present bilaterally.  Dermatologic Skin healing well without signs of infection. Skin edges well coapted without signs of infection.  Orthopedic: Mild tenderness to palpation noted about the surgical site.  Decreased from prior decreased edema from prior   Multiple view plain film radiographs: Deferred at this visit we will take new x-rays at next visit  Assessment:   1. Tailor's bunion of right foot   2. Bursitis of right foot   3. Benign neoplasm of skin of right foot     Plan:  Patient was evaluated and treated and all questions  answered.  S/p foot surgery right tailor's bunionectomy -Progressing as expected post-operatively.  Patient doing well no concerns at this time -XR: As above no immediate complication noted -WB Status: Minimal weightbearing as tolerated in cam boot for next 2 weeks -Sutures: Absorbable sutures tails removed at this visit -Medications: Pain medications as needed -Foot redressed. -Discussed that patient can now get the right foot wet in the shower or wash with warm soapy water but should dry thoroughly after and replace with large adhesive bandage for protection of the incision site or wrap with dry gauze and Ace wrap -Continue with cam boot immobilization until next visit  Return in about 2 weeks (around 11/21/2022) for 3rd POV R foot tailrs bunionectomy.         Everitt Amber, DPM [facility] [facility]

## 2022-11-21 ENCOUNTER — Ambulatory Visit (INDEPENDENT_AMBULATORY_CARE_PROVIDER_SITE_OTHER): Payer: 59 | Admitting: Podiatry

## 2022-11-21 ENCOUNTER — Encounter: Payer: Self-pay | Admitting: Podiatry

## 2022-11-21 ENCOUNTER — Ambulatory Visit (INDEPENDENT_AMBULATORY_CARE_PROVIDER_SITE_OTHER): Payer: 59

## 2022-11-21 DIAGNOSIS — M21621 Bunionette of right foot: Secondary | ICD-10-CM

## 2022-11-21 DIAGNOSIS — Z9889 Other specified postprocedural states: Secondary | ICD-10-CM

## 2022-11-21 NOTE — Progress Notes (Signed)
Presents today for postop visit date of surgery 10/25/2018 for tailor's bunionectomy fifth metatarsal osteotomy resection soft tissue mass states that I cannot seem to relax the toe then probably will cause him walking on the medial aspect of my foot as she points to her big toe.  Objective: Presents today in her cam walker Band-Aid was intact once removed demonstrates no dehiscence of the wound.  Appears to be doing quite well there is minimal edema no erythema cellulitis drainage or odor she has good range of motion of the toes with dorsiflexion of the foot plantarflexion of the toes and vice versa.  She has no crepitation on range of motion no tenderness   Assessment: Well-healing surgical foot  Plan: I placed her in a Darco shoe today hopefully this will allow her to get around a little better while at home.  I did recommend that she wear her cam boot if she should leave the house.  Otherwise I will follow-up with her in a couple weeks for another set of x-rays and hopefully that time we will be able to get into regular shoe gear provided the radiographs demonstrate good bone healing.

## 2022-12-12 ENCOUNTER — Ambulatory Visit (INDEPENDENT_AMBULATORY_CARE_PROVIDER_SITE_OTHER): Payer: 59

## 2022-12-12 ENCOUNTER — Ambulatory Visit (INDEPENDENT_AMBULATORY_CARE_PROVIDER_SITE_OTHER): Payer: 59 | Admitting: Podiatry

## 2022-12-12 ENCOUNTER — Encounter: Payer: Self-pay | Admitting: Podiatry

## 2022-12-12 DIAGNOSIS — M21621 Bunionette of right foot: Secondary | ICD-10-CM

## 2022-12-12 DIAGNOSIS — Z9889 Other specified postprocedural states: Secondary | ICD-10-CM

## 2022-12-13 NOTE — Progress Notes (Signed)
She presents today date of surgery October 25, 2022 tailor's bunionectomy fifth metatarsal osteotomy with resection soft tissue mass.  She states that she is doing quite well really no problems whatsoever.  Objective: Vital signs are stable alert oriented x 3 there is no erythema cellulitis drainage or odor.  Incision site is gone on to heal uneventfully.  She has good range of motion of the toe though slightly limited on plantarflexion.  Radiographs taken today demonstrate well-healing osteotomy with screw fixation.  Assessment: Well-healing surgical toe and fifth metatarsal osteotomy.  Plan allow her to back into her regular shoe gear but not to overdo it with her exercise regimen.  She understands this is amenable to it.  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  Follow-up with her in a few weeks for her final set of x-rays.

## 2022-12-31 ENCOUNTER — Ambulatory Visit (INDEPENDENT_AMBULATORY_CARE_PROVIDER_SITE_OTHER): Payer: 59

## 2022-12-31 ENCOUNTER — Encounter: Payer: Self-pay | Admitting: Obstetrics and Gynecology

## 2022-12-31 ENCOUNTER — Ambulatory Visit (INDEPENDENT_AMBULATORY_CARE_PROVIDER_SITE_OTHER): Payer: 59 | Admitting: Obstetrics and Gynecology

## 2022-12-31 VITALS — BP 130/72 | HR 68

## 2022-12-31 DIAGNOSIS — N950 Postmenopausal bleeding: Secondary | ICD-10-CM

## 2022-12-31 NOTE — Progress Notes (Signed)
GYNECOLOGY  VISIT   HPI: 62 y.o.   Married White or Caucasian Not Hispanic or Latino  female   [phone] with Patient's last menstrual period was 10/11/2016.   here for further evaluation of PMP bleeding. H/O breast cancer and prior tamoxifen treatment.   Pap 10/22/22 WNL, neg hpv U/S from 10/24/22 showed a 6.69 mm endometrial stripe without obvious masses.  Endometrial biopsy from 10/24/22 with inactive endometrium.   GYNECOLOGIC HISTORY: Patient's last menstrual period was 10/11/2016. Contraception:PMP Menopausal hormone therapy: no        OB History     Gravida  2   Para  2   Term  2   Preterm  0   AB  0   Living  2      SAB  0   IAB  0   Ectopic  0   Multiple  0   Live Births  2              Patient Active Problem List   Diagnosis Date Noted   Colitis 04/12/2020   Family history of colon cancer 04/12/2020   Migraine 02/22/2020   Use of tamoxifen (Nolvadex) 02/22/2020   Obesity 02/22/2020   Prediabetes 06/29/2018   MDD (major depressive disorder), recurrent, in full remission 02/18/2017   Breast cancer of lower-outer quadrant of right female breast 01/30/2016   KCS (keratoconjunctivitis sicca) 05/10/2014   Myopic astigmatism of both eyes 05/10/2014    Past Medical History:  Diagnosis Date   Abnormal cholesterol test    Allergies    Allergy 2014   Amenorrhea    Anxiety    B12 deficiency    Back pain    Breast cancer 2017   s/p surgery and radiation   Cataract 2018   Constipation    Depression    Dysmenorrhea    Fatty liver    High blood pressure    pt denies   High blood sugar    High triglycerides    pt denies   Migraines    Pre-diabetes    Scheuermann's disease    Sleep apnea    pt stated she wa snever dx   Vitamin D deficiency     Past Surgical History:  Procedure Laterality Date   BREAST LUMPECTOMY Right 2017   GUM SURGERY     alloderm inplantation   WISDOM TOOTH EXTRACTION      Current Outpatient Medications   Medication Sig Dispense Refill   carboxymethylcellulose (REFRESH PLUS) 0.5 % SOLN 1 drop 3 (three) times daily as needed.     Cholecalciferol 25 MCG (1000 UT) tablet Take 1,000 Units by mouth daily.     clobetasol (TEMOVATE) 0.05 % external solution      loratadine (CLARITIN) 10 MG tablet Take 10 mg by mouth daily.     Magnesium Citrate 100 MG TABS Take by mouth.     Multiple Vitamins-Minerals (CENTRUM ADULTS PO) Take by mouth.     venlafaxine XR (EFFEXOR-XR) 75 MG 24 hr capsule Take 1 capsule (75 mg total) by mouth daily with breakfast. 90 capsule 1   vitamin B-12 (CYANOCOBALAMIN) 1000 MCG tablet Take 1,000 mcg by mouth daily.     No current facility-administered medications for this visit.     ALLERGIES: Codeine  Family History  Problem Relation Age of Onset   Cancer Mother        mouth   Early death Mother    Esophageal cancer Mother    Arthritis Father  Hypertension Father    Sleep apnea Father    Depression Sister    Breast cancer Sister    Arthritis Maternal Grandmother    Cancer Maternal Grandmother        septocemia   Arthritis Maternal Grandfather    COPD Maternal Grandfather    Lung cancer Maternal Grandfather        smoker   Stroke Maternal Grandfather    Arthritis Paternal Grandmother    Colon cancer Paternal Grandmother        41's   Alzheimer's disease Paternal Grandmother        ?   Arthritis Paternal Grandfather    Lung cancer Paternal Grandfather        abspetosis   Other Daughter        erythromelalgia   Rectal cancer Neg Hx    Stomach cancer Neg Hx     Social History   Socioeconomic History   Marital status: Married    Spouse name: Not on file   Number of children: 2   Years of education: Not on file   Highest education level: Not on file  Occupational History   Occupation: Sr Recruitment consultant  Tobacco Use   Smoking status: Never    Passive exposure: Never   Smokeless tobacco: Never   Tobacco comments:    Never smoked  Vaping Use    Vaping Use: Never used  Substance and Sexual Activity   Alcohol use: Never   Drug use: Never   Sexual activity: Yes    Partners: Male    Birth control/protection: Post-menopausal  Other Topics Concern   Not on file  Social History Narrative   Husband is a Education officer, environmental   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   Social Determinants of Corporate investment banker Strain: Not on [employer] Insecurity: Not on file  Transportation Needs: Not on file  Physical Activity: Not on file  Stress: Not on file  Social Connections: Not on file  Intimate Partner Violence: Not on file    ROS  PHYSICAL EXAMINATION:    LMP 10/11/2016     General appearance: alert, cooperative and appears stated age  Pelvic: External genitalia:  no lesions              Urethra:  normal appearing urethra with no masses, tenderness or lesions              Bartholins and Skenes: normal                 Vagina: normal appearing vagina with normal color and discharge, no lesions              Cervix: no lesions                Sonohysterogram:  The procedure and risks of the procedure were reviewed with the patient, consent form was signed.  Normal sized anteverted uterus, neither ovary was seen.   A speculum was placed in the vagina and the cervix was cleansed with Hibiclens. The sonohysterogram catheter was inserted into the uterine cavity without difficulty. Saline was infused under direct observation with the ultrasound. No intracavitary defects were noted.The cavity was uniformly thin. Double thickness endometrium was 4.3 mm. The catheter was removed.  Chaperone was present for exam.  1. Postmenopausal bleeding Benign pap, benign endometrial biopsy, negative sonohysterogram -Patient reassured, reach out with further bleeding.

## 2023-01-01 ENCOUNTER — Other Ambulatory Visit: Payer: Self-pay | Admitting: Physician Assistant

## 2023-01-05 ENCOUNTER — Encounter: Payer: Self-pay | Admitting: Podiatry

## 2023-01-14 ENCOUNTER — Ambulatory Visit (INDEPENDENT_AMBULATORY_CARE_PROVIDER_SITE_OTHER): Payer: 59

## 2023-01-14 ENCOUNTER — Ambulatory Visit (INDEPENDENT_AMBULATORY_CARE_PROVIDER_SITE_OTHER): Payer: 59 | Admitting: Podiatry

## 2023-01-14 ENCOUNTER — Encounter: Payer: Self-pay | Admitting: Podiatry

## 2023-01-14 DIAGNOSIS — Z9889 Other specified postprocedural states: Secondary | ICD-10-CM | POA: Diagnosis not present

## 2023-01-14 DIAGNOSIS — S9031XA Contusion of right foot, initial encounter: Secondary | ICD-10-CM | POA: Diagnosis not present

## 2023-01-14 DIAGNOSIS — M21621 Bunionette of right foot: Secondary | ICD-10-CM | POA: Diagnosis not present

## 2023-01-14 NOTE — Progress Notes (Unsigned)
She presents today for follow-up of her surgical foot.  States that Thursday before last she had pain and swelling with no history of trauma to the lesser metatarsals of the right foot excluding the surgical metatarsal.  She Redus on MyChart and we had instructed her to get back into her cam walker.  She states that she still has pain still has swelling.  Objective: Vital signs stable alert oriented x 3.  Pulses are palpable.  She has pain on palpation to the second and third metatarsal shafts of the right foot.  There is overlying edema and some of that appears to be pitting particularly laterally.  Radiographs taken today demonstrate well-healing surgical foot with double screw fixation fifth metatarsal right foot.  Does not demonstrate any type of osseous abnormalities no bone callus is identifiable..  Soft tissue swelling is noted dorsally from lateral view.  Assessment: Contusion foot stress reaction and ostial stress reaction cannot rule out stress fracture.  Well-healing surgical foot.  Plan: I will ask her to stay in her boot for another 2 weeks follow-up with another set of x-rays.

## 2023-01-28 ENCOUNTER — Ambulatory Visit (INDEPENDENT_AMBULATORY_CARE_PROVIDER_SITE_OTHER): Payer: 59

## 2023-01-28 ENCOUNTER — Ambulatory Visit (INDEPENDENT_AMBULATORY_CARE_PROVIDER_SITE_OTHER): Payer: 59 | Admitting: Podiatry

## 2023-01-28 ENCOUNTER — Encounter: Payer: Self-pay | Admitting: Podiatry

## 2023-01-28 DIAGNOSIS — M21621 Bunionette of right foot: Secondary | ICD-10-CM | POA: Diagnosis not present

## 2023-01-29 NOTE — Progress Notes (Signed)
She presents today for follow-up of her surgical foot where she reinjured it and fractured her metatarsal.  She states that the surgical site is doing quite well though somewhat tight.  Still has some swelling in that right foot but it seems to be improving.  Objective: Vital signs stable alert oriented x 3 the area is warm to the touch is still some swelling across the dorsal aspect around the next of the metatarsals particularly the second and the third.  Surgical site has gone on to heal uneventfully she does have some restriction on plantarflexion of the fifth digit.  Assessment: Still healing metatarsals right foot from injury.  Well-healing surgical foot.  Plan: At this point I recommend she continue to wear the boot for another couple weeks then transition from boot into her tennis shoe but no bare feet.  She will then wear the tennis shoe for couple weeks before progressing to exercise and regular activities.  I will follow-up with her at that time for another set of x-rays

## 2023-02-24 IMAGING — CT CT ABD-PELV W/O CM
2 of 4 series · 17 of 46 positions shown, 19 images · non-contrast
Comparison: 04/27/2019

CLINICAL DATA: Lower abdominal pain and history of constipation

EXAM:
CT ABDOMEN AND PELVIS WITHOUT CONTRAST
TECHNIQUE: Multidetector CT imaging of the abdomen and pelvis was performed
following the standard protocol without IV contrast.

[Series 2: axial st · axial · 0.82mm/px · z∈[+1066,+1466]mm · 14 of 92 slices shown, 16 images]
[im 6/92  soft-tissue]
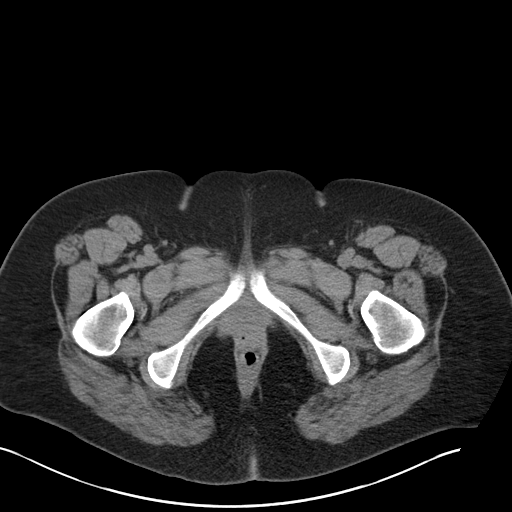
[im 6/92  bone]
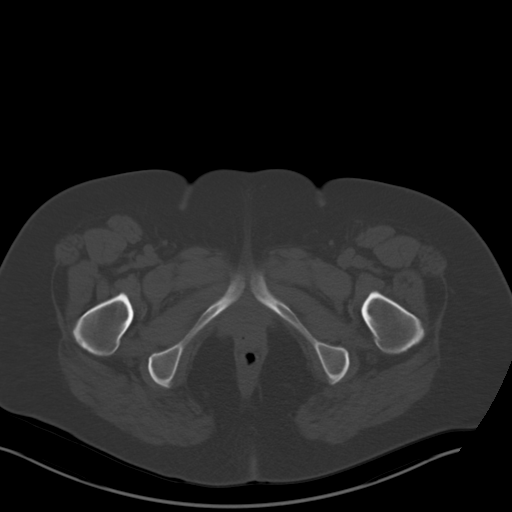
[im 11/92  soft-tissue]
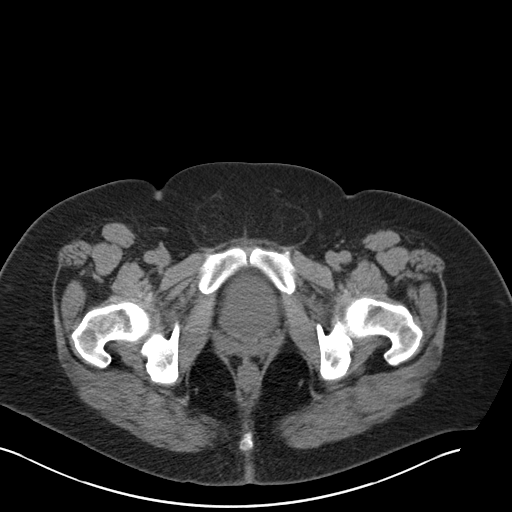
[im 17/92  soft-tissue]
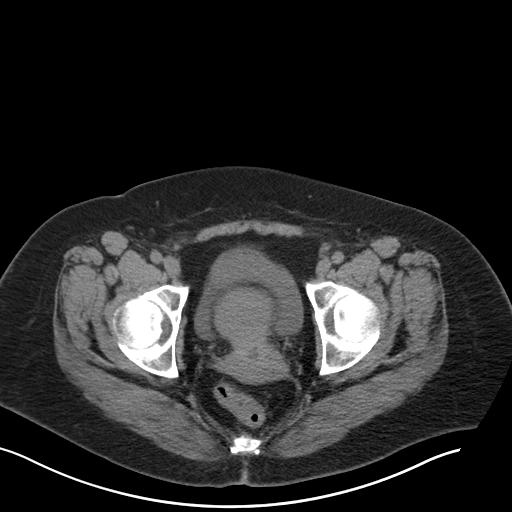
[im 27/92  soft-tissue]
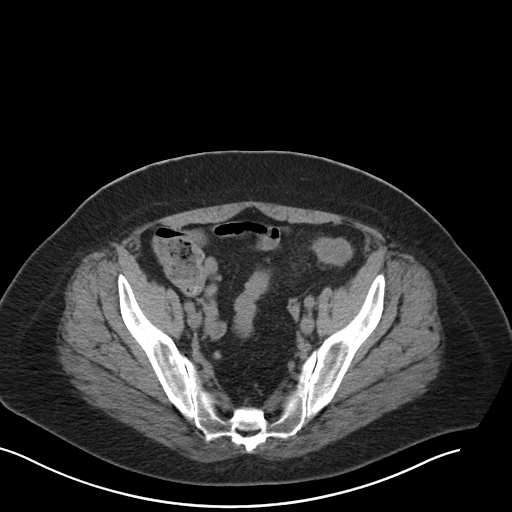
[im 33/92  soft-tissue]
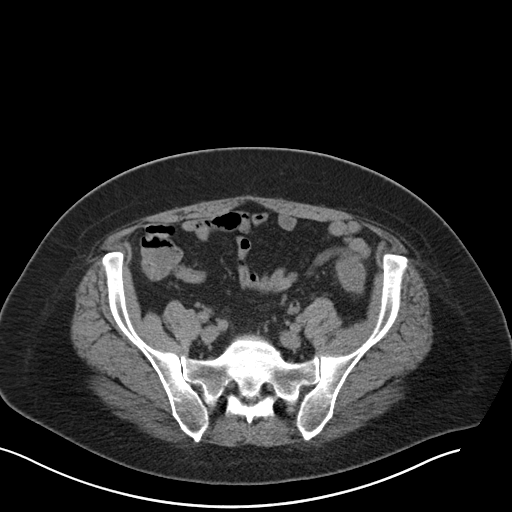
[im 38/92  soft-tissue]
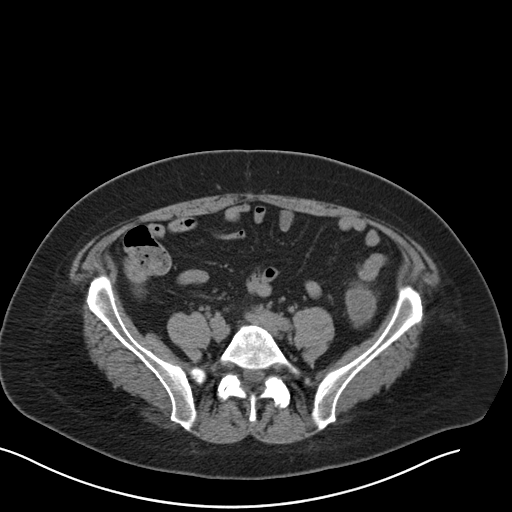
[im 43/92  soft-tissue]
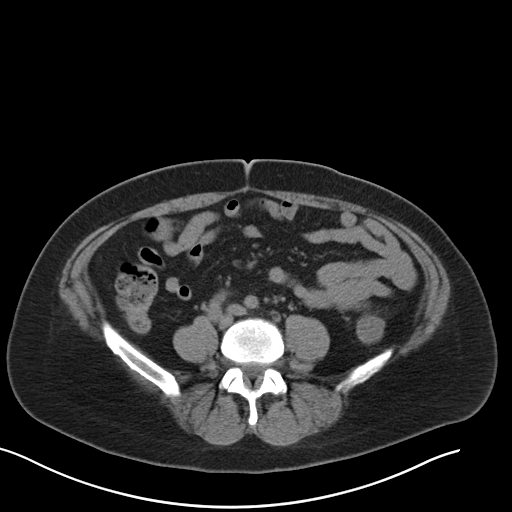
[im 49/92  soft-tissue]
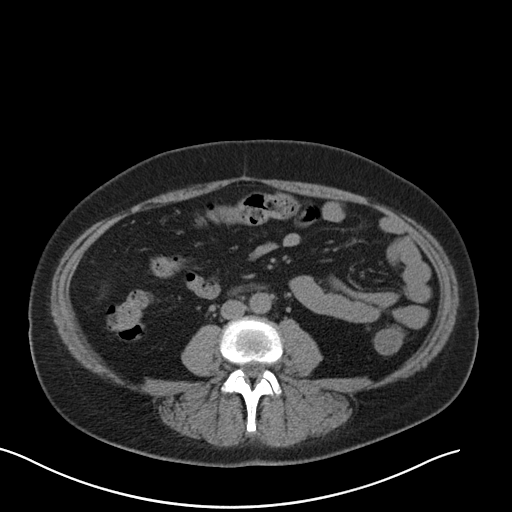
[im 54/92  soft-tissue]
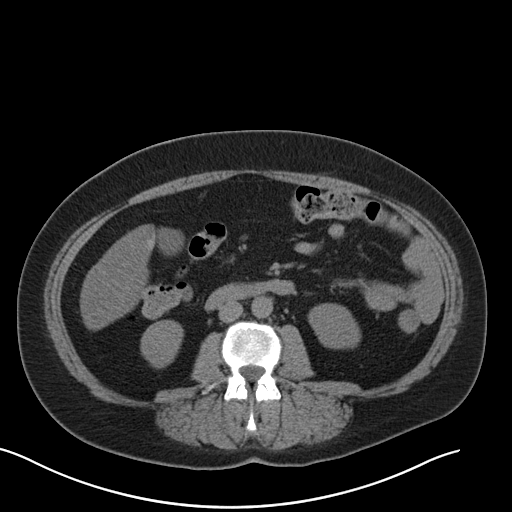
[im 54/92  bone]
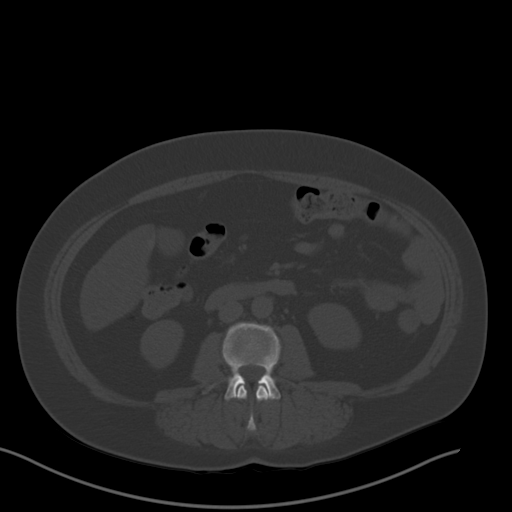
[im 59/92  soft-tissue]
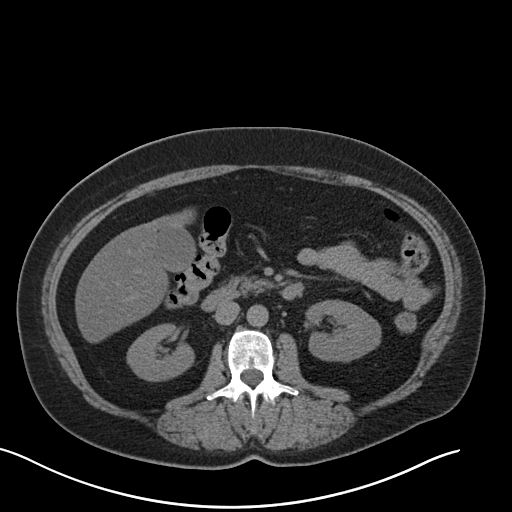
[im 70/92  soft-tissue]
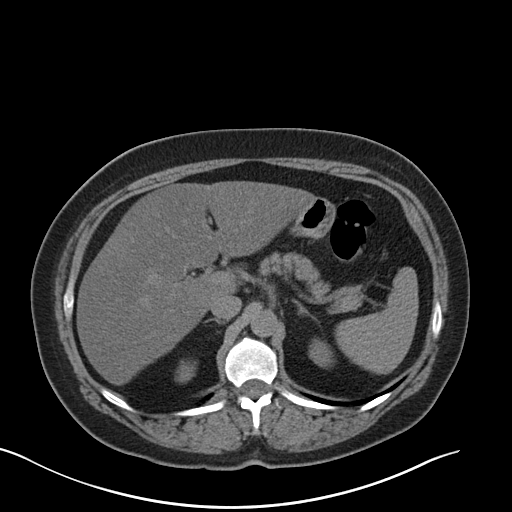
[im 75/92  soft-tissue]
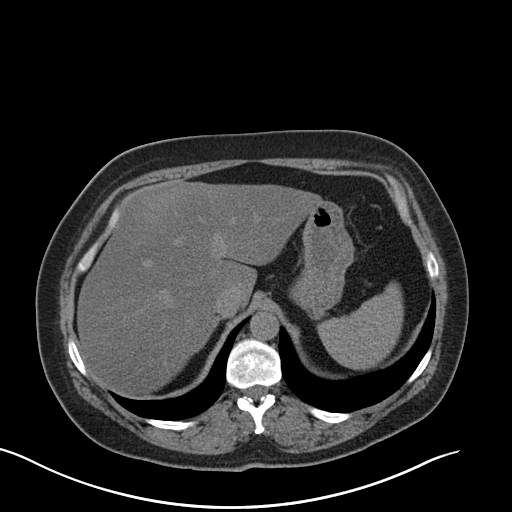
[im 81/92  soft-tissue]
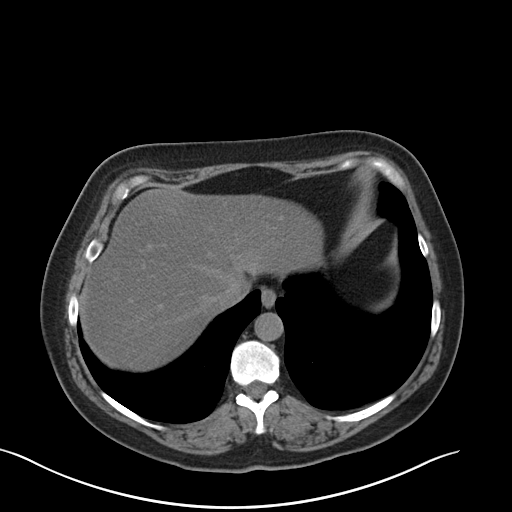
[im 86/92  soft-tissue]
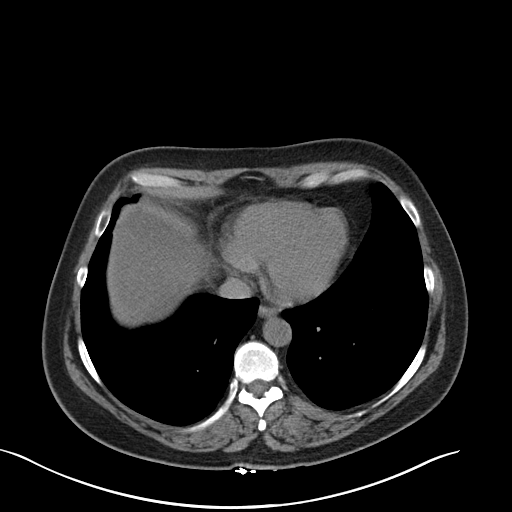

[Series 4: coronal st · coronal · 1.03mm/px · 3 of 127 slices shown]
[im 43/127  soft-tissue]
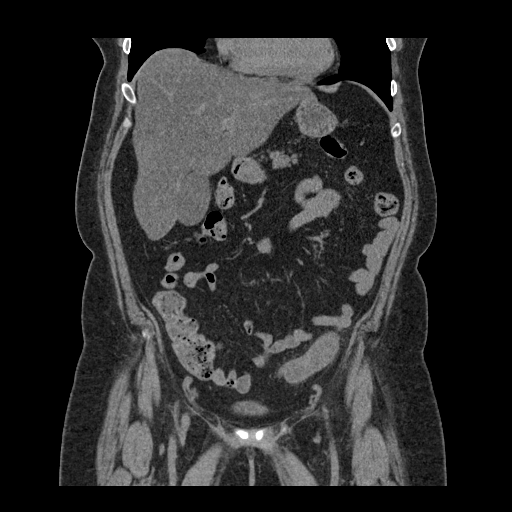
[im 57/127  soft-tissue]
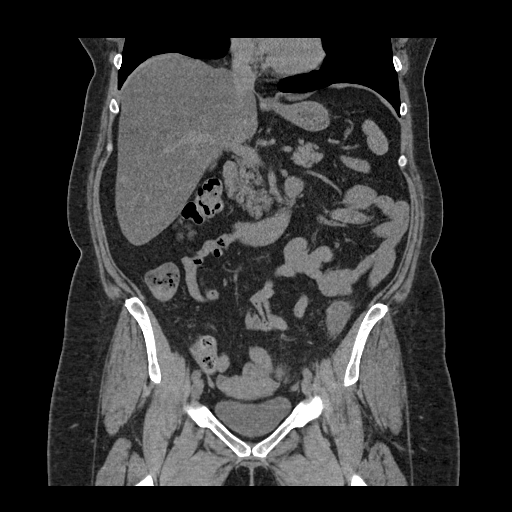
[im 71/127  soft-tissue]
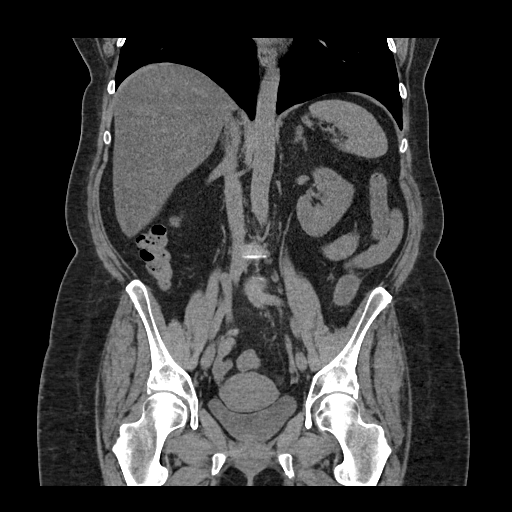

[17 of 46 positions shown; findings below may reference images not displayed]

FINDINGS: Lower chest: No acute abnormality.

Hepatobiliary: Fatty infiltration of the liver is noted. The
gallbladder is within normal limits.

Pancreas: Unremarkable. No pancreatic ductal dilatation or
surrounding inflammatory changes.

Spleen: Normal in size without focal abnormality.

Adrenals/Urinary Tract: Adrenal glands are within normal limits.
Kidneys are well visualized bilaterally. No renal calculi or
obstructive changes are noted. The bladder is partially distended.

Stomach/Bowel: The appendix is well visualized and within normal
limits. Very mild inflammatory changes of the distal descending
colon and proximal sigmoid are noted without perforation or abscess.
No significant diverticular changes noted in these changes are
likely related to focal colitis. Small bowel and stomach are within
normal limits.

Vascular/Lymphatic: No significant vascular findings are present. No
enlarged abdominal or pelvic lymph nodes.

Reproductive: Uterus and bilateral adnexa are unremarkable.

Other: No abdominal wall hernia or abnormality. No abdominopelvic
ascites.

Musculoskeletal: No acute or significant osseous findings.
IMPRESSION: Changes consistent with mild colitis in the distal descending and
proximal sigmoid colons.

Fatty liver.

No other focal abnormality is noted.

## 2023-02-25 ENCOUNTER — Other Ambulatory Visit: Payer: Self-pay | Admitting: Podiatry

## 2023-02-25 ENCOUNTER — Ambulatory Visit (INDEPENDENT_AMBULATORY_CARE_PROVIDER_SITE_OTHER): Payer: 59

## 2023-02-25 ENCOUNTER — Ambulatory Visit: Payer: 59 | Admitting: Podiatry

## 2023-02-25 DIAGNOSIS — M778 Other enthesopathies, not elsewhere classified: Secondary | ICD-10-CM

## 2023-02-25 NOTE — Progress Notes (Signed)
She presents today date of surgery 10/25/2022 status post fifth metatarsal osteotomy with resection of mass.  She is states that she is a little bit tender still as she refers to as she refers to the second and third metatarsal area with some swelling.  Objective: Vital signs stable alert oriented x 3 much decrease in edema no erythema cellulitis drainage or odor much decrease in the scar tissue and manipulation of the scar has improved considerably.  Radiographs taken today demonstrate 2 screws to the fifth metatarsal head appear to be intact with no complications no loosening.  Bone is gone on to heal uneventfully.  Assessment: Well-healing surgical foot no signs of stress fractures.  Plan: Discussed etiology pathology conservative therapies at this point we will see her and she will follow-up with [place] as needed.

## 2023-03-04 LAB — HM MAMMOGRAPHY

## 2023-03-05 LAB — CBC AND DIFFERENTIAL
HCT: 40 (ref 36–46)
Hemoglobin: 13.7 (ref 12.0–16.0)
Neutrophils Absolute: 5848
WBC: 10.1

## 2023-03-05 LAB — CBC: RBC: 4.5 (ref 3.87–5.11)

## 2023-03-05 LAB — HEPATIC FUNCTION PANEL
ALT: 13 U/L (ref 7–35)
AST: 12 — AB (ref 13–35)
Alkaline Phosphatase: 74 (ref 25–125)
Bilirubin, Total: 0.9

## 2023-03-05 LAB — BASIC METABOLIC PANEL
BUN: 14 (ref 4–21)
CO2: 25 — AB (ref 13–22)
Chloride: 103 (ref 99–108)
Creatinine: 0.7 (ref 0.5–1.1)
Glucose: 119
Potassium: 4.2 mEq/L (ref 3.5–5.1)
Sodium: 140 (ref 137–147)

## 2023-03-05 LAB — HEMOGLOBIN A1C: Hemoglobin A1C: 8.4

## 2023-03-05 LAB — COMPREHENSIVE METABOLIC PANEL
Albumin: 4.4 (ref 3.5–5.0)
Calcium: 9.3 (ref 8.7–10.7)
Globulin: 2.5
eGFR: 96

## 2023-03-05 LAB — VITAMIN D 25 HYDROXY (VIT D DEFICIENCY, FRACTURES): Vit D, 25-Hydroxy: 21

## 2023-03-17 ENCOUNTER — Ambulatory Visit: Payer: 59 | Admitting: Physician Assistant

## 2023-03-17 ENCOUNTER — Encounter: Payer: Self-pay | Admitting: Physician Assistant

## 2023-03-17 VITALS — BP 120/70 | HR 61 | Temp 97.3°F | Ht 67.75 in | Wt 219.0 lb

## 2023-03-17 DIAGNOSIS — R5381 Other malaise: Secondary | ICD-10-CM | POA: Diagnosis not present

## 2023-03-17 DIAGNOSIS — Z6833 Body mass index (BMI) 33.0-33.9, adult: Secondary | ICD-10-CM

## 2023-03-17 DIAGNOSIS — E669 Obesity, unspecified: Secondary | ICD-10-CM

## 2023-03-17 DIAGNOSIS — R7303 Prediabetes: Secondary | ICD-10-CM | POA: Diagnosis not present

## 2023-03-17 DIAGNOSIS — E559 Vitamin D deficiency, unspecified: Secondary | ICD-10-CM | POA: Diagnosis not present

## 2023-03-17 MED ORDER — VITAMIN D (ERGOCALCIFEROL) 1.25 MG (50000 UNIT) PO CAPS: 50000.0000 [IU] | ORAL_CAPSULE | ORAL | 0 refills | Status: DC

## 2023-03-17 NOTE — Patient Instructions (Signed)
It was great to see you!  My goal for you is to lose at least 5% of your body weight by the time I see you in October. If we do not reach this goal, we will likely start medications for your blood sugars.  Referral for physical therapy at [facility]  I will send in Vitamin D  [facility] Membership Information --> Please google "[facility] for more information" [facility] is a medically-supervised fitness membership at [facility] at [facility]. With state-of-the-art equipment in its 30,000 sq. ft. fitness area, this facility offers a full-service program that is based on health education, screenings and the prevention or treatment of disease.  Members pay an enrollment fee and a monthly rate for access to a wide variety of features and amenities, including cardio equipment, strength machines, free weights, a walking/running track, group exercise classes, a basketball court and an aquatics center. Complimentary fitness assessments to help you track your progress. Individual, couple and family membership categories are available. Family membership includes free babysitting services and discounts to Play! (interactive learning for children).   Take care,  Jarold Motto PA-C

## 2023-03-17 NOTE — Progress Notes (Signed)
Alexandra Fitzpatrick is a 62 y.o. female here for a new problem.  History of Present Illness:   Chief Complaint  Patient presents with   lab results    Pt here to discuss A1c that was done on 6/26 it was 8.2 and Vit D was 21.    HPI  A1C: Her A1C measured [facility] at [facility] was 8.4 on 06/26. She does not have a history of diabetes but has a history of pre-diabetes.  This is her third reading in her lifetime of an A1C over 6.5.  Lab Results  Component Value Date   HGBA1C 6.5 10/12/2018   Vitamin D:  Her vitamin D levels measured 21 when taken at 06/26 at the [facility].  She is interested in vitamin D supplements to help increase her vitamin D levels.  Lab Results  Component Value Date   VD25OH 30.21 07/04/2022   Obesity She reports gaining 5 lbs recently due to injuring her foot and being in a boot causing her exercise to decrease.  She has seen a healthy weight and wellness specialist and was put on a diet.  She is maintaining a healthy diet at this time and is keeping up good water intake. She making an effort to decrease carbohydrates and eliminating unhealthy snacks.  She is not interested in taking ozmepic or wegovy due to side effects.   She is not exercising regularly at this time and is planning on exercising more.  Wt Readings from Last 3 Encounters:  03/17/23 219 lb (99.3 kg)  10/24/22 215 lb (97.5 kg)  10/22/22 215 lb (97.5 kg)    Past Medical History:  Diagnosis Date   Abnormal cholesterol test    Allergies    Allergy 2014   Amenorrhea    Anxiety    B12 deficiency    Back pain    Breast cancer (HCC) 2017   s/p surgery and radiation   Cataract 2018   Constipation    Depression    Dysmenorrhea    Fatty liver    High blood pressure    pt denies   High blood sugar    High triglycerides    pt denies   Migraines    Pre-diabetes    Scheuermann's disease    Sleep apnea    pt stated she wa snever dx   Vitamin  D deficiency      Social History   Tobacco Use   Smoking status: Never    Passive exposure: Never   Smokeless tobacco: Never   Tobacco comments:    Never smoked  Vaping Use   Vaping Use: Never used  Substance Use Topics   Alcohol use: Never   Drug use: Never    Past Surgical History:  Procedure Laterality Date   BREAST LUMPECTOMY Right 2017   GUM SURGERY     alloderm inplantation   WISDOM TOOTH EXTRACTION      Family History  Problem Relation Age of Onset   Cancer Mother        mouth   Early death Mother    Esophageal cancer Mother    Arthritis Father    Hypertension Father    Sleep apnea Father    Depression Sister    Breast cancer Sister    Arthritis Maternal Grandmother    Cancer Maternal Grandmother        septocemia   Arthritis Maternal Grandfather    COPD Maternal Grandfather  Lung cancer Maternal Grandfather        smoker   Stroke Maternal Grandfather    Arthritis Paternal Grandmother    Colon cancer Paternal Grandmother        22's   Alzheimer's disease Paternal Grandmother        ?   Arthritis Paternal Grandfather    Lung cancer Paternal Grandfather        abspetosis   Other Daughter        erythromelalgia   Rectal cancer Neg Hx    Stomach cancer Neg Hx     Allergies  Allergen Reactions   Codeine Other (See Comments) and Shortness Of Breath    Current Medications:   Current Outpatient Medications:    carboxymethylcellulose (REFRESH PLUS) 0.5 % SOLN, 1 drop 3 (three) times daily as needed., Disp: , Rfl:    Cholecalciferol 25 MCG (1000 UT) tablet, Take 1,000 Units by mouth daily., Disp: , Rfl:    clobetasol (TEMOVATE) 0.05 % external solution, , Disp: , Rfl:    loratadine (CLARITIN) 10 MG tablet, Take 10 mg by mouth daily., Disp: , Rfl:    Magnesium Citrate 100 MG TABS, Take by mouth., Disp: , Rfl:    Multiple Vitamins-Minerals (CENTRUM ADULTS PO), Take by mouth., Disp: , Rfl:    venlafaxine XR (EFFEXOR-XR) 75 MG 24 hr capsule, TAKE 1  CAPSULE(75 MG) BY MOUTH DAILY WITH BREAKFAST, Disp: 90 capsule, Rfl: 1   vitamin B-12 (CYANOCOBALAMIN) 1000 MCG tablet, Take 1,000 mcg by mouth daily., Disp: , Rfl:    Vitamin D, Ergocalciferol, (DRISDOL) 1.25 MG (50000 UNIT) CAPS capsule, Take 1 capsule (50,000 Units total) by mouth every 7 (seven) days., Disp: 12 capsule, Rfl: 0   Review of Systems:   ROS Negative unless otherwise specified per HPI.  Vitals:   Vitals:   03/17/23 1502  BP: 120/70  Pulse: 61  Temp: (!) 97.3 F (36.3 C)  TempSrc: Temporal  SpO2: 97%  Weight: 219 lb (99.3 kg)  Height: 5' 7.75" (1.721 m)     Body mass index is 33.55 kg/m.  Physical Exam:   Physical Exam Constitutional:      Appearance: Normal appearance. She is well-developed.  HENT:     Head: Normocephalic and atraumatic.  Eyes:     General: Lids are normal.     Extraocular Movements: Extraocular movements intact.     Conjunctiva/sclera: Conjunctivae normal.  Pulmonary:     Effort: Pulmonary effort is normal.  Musculoskeletal:        General: Normal range of motion.     Cervical back: Normal range of motion and neck supple.  Skin:    General: Skin is warm and dry.  Neurological:     Mental Status: She is alert and oriented to person, place, and time.  Psychiatric:        Attention and Perception: Attention and perception normal.        Mood and Affect: Mood normal.        Behavior: Behavior normal.        Thought Content: Thought content normal.        Judgment: Judgment normal.     Assessment and Plan:   Prediabetes Blood sugars are now in the diabetic range Discussed treatment options -- she would like to trial no medication and intensify exercise at this time  If medication needed, will likely trial metformin as she is concerned about possible side effect(s) of GLP-1s Declined nutritionist referral as she is well  versed in healthy eating Follow-up in 3 months, sooner if concerns  Physical deconditioning Referral to  physical therapy at [facility]  Vitamin D deficiency Will start Vitamin D 50,000 IU weekly x 3 months  Obesity, unspecified classification, unspecified obesity type, unspecified whether serious comorbidity present Discussed plan to get exercise regimen in place - to find something that she enjoys doing Consider [facility] program vs joining [facility] We did go ahead and refer for physical therapy at [facility]   [employer]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  I, Jarold Motto, [place], have reviewed all documentation for this visit. The documentation on 03/17/23 for the exam, diagnosis, procedures, and orders are all accurate and complete.   Jarold Motto, PA-C

## 2023-03-18 ENCOUNTER — Encounter: Payer: Self-pay | Admitting: Physician Assistant

## 2023-03-19 ENCOUNTER — Encounter: Payer: Self-pay | Admitting: Physician Assistant

## 2023-03-25 ENCOUNTER — Ambulatory Visit: Payer: 59 | Admitting: Physical Therapy

## 2023-06-07 ENCOUNTER — Other Ambulatory Visit: Payer: Self-pay | Admitting: Physician Assistant

## 2023-06-30 ENCOUNTER — Other Ambulatory Visit: Payer: Self-pay | Admitting: Physician Assistant

## 2023-07-07 ENCOUNTER — Ambulatory Visit (INDEPENDENT_AMBULATORY_CARE_PROVIDER_SITE_OTHER): Payer: 59 | Admitting: Physician Assistant

## 2023-07-07 ENCOUNTER — Encounter: Payer: Self-pay | Admitting: Physician Assistant

## 2023-07-07 VITALS — BP 110/70 | HR 66 | Temp 97.3°F | Ht 67.5 in | Wt 214.0 lb

## 2023-07-07 DIAGNOSIS — E669 Obesity, unspecified: Secondary | ICD-10-CM | POA: Diagnosis not present

## 2023-07-07 DIAGNOSIS — Z0000 Encounter for general adult medical examination without abnormal findings: Secondary | ICD-10-CM

## 2023-07-07 DIAGNOSIS — R7303 Prediabetes: Secondary | ICD-10-CM

## 2023-07-07 DIAGNOSIS — Z23 Encounter for immunization: Secondary | ICD-10-CM | POA: Diagnosis not present

## 2023-07-07 DIAGNOSIS — Z136 Encounter for screening for cardiovascular disorders: Secondary | ICD-10-CM

## 2023-07-07 DIAGNOSIS — E559 Vitamin D deficiency, unspecified: Secondary | ICD-10-CM | POA: Diagnosis not present

## 2023-07-07 LAB — CBC WITH DIFFERENTIAL/PLATELET
Basophils Absolute: 0 10*3/uL (ref 0.0–0.1)
Basophils Relative: 0.6 % (ref 0.0–3.0)
Eosinophils Absolute: 0.2 10*3/uL (ref 0.0–0.7)
Eosinophils Relative: 2.1 % (ref 0.0–5.0)
HCT: 41.1 % (ref 36.0–46.0)
Hemoglobin: 13.3 g/dL (ref 12.0–15.0)
Lymphocytes Relative: 35.4 % (ref 12.0–46.0)
Lymphs Abs: 2.9 10*3/uL (ref 0.7–4.0)
MCHC: 32.4 g/dL (ref 30.0–36.0)
MCV: 91.9 fL (ref 78.0–100.0)
Monocytes Absolute: 0.5 10*3/uL (ref 0.1–1.0)
Monocytes Relative: 6.4 % (ref 3.0–12.0)
Neutro Abs: 4.5 10*3/uL (ref 1.4–7.7)
Neutrophils Relative %: 55.5 % (ref 43.0–77.0)
Platelets: 237 10*3/uL (ref 150.0–400.0)
RBC: 4.48 Mil/uL (ref 3.87–5.11)
RDW: 13.8 % (ref 11.5–15.5)
WBC: 8.1 10*3/uL (ref 4.0–10.5)

## 2023-07-07 LAB — LIPID PANEL
Cholesterol: 224 mg/dL — ABNORMAL HIGH (ref 0–200)
HDL: 49.2 mg/dL (ref >39.00)
LDL Cholesterol: 139 mg/dL — ABNORMAL HIGH (ref 0–99)
NonHDL: 174.58
Total CHOL/HDL Ratio: 5
Triglycerides: 180 mg/dL — ABNORMAL HIGH (ref 0.0–149.0)
VLDL: 36 mg/dL (ref 0.0–40.0)

## 2023-07-07 LAB — COMPREHENSIVE METABOLIC PANEL
ALT: 14 U/L (ref 0–35)
AST: 12 U/L (ref 0–37)
Albumin: 4.1 g/dL (ref 3.5–5.2)
Alkaline Phosphatase: 61 U/L (ref 39–117)
BUN: 14 mg/dL (ref 6–23)
CO2: 27 meq/L (ref 19–32)
Calcium: 9.2 mg/dL (ref 8.4–10.5)
Chloride: 105 meq/L (ref 96–112)
Creatinine, Ser: 0.87 mg/dL (ref 0.40–1.20)
GFR: 72.3 mL/min (ref >60.00)
Glucose, Bld: 155 mg/dL — ABNORMAL HIGH (ref 70–99)
Potassium: 4 meq/L (ref 3.5–5.1)
Sodium: 139 meq/L (ref 135–145)
Total Bilirubin: 0.7 mg/dL (ref 0.2–1.2)
Total Protein: 6.8 g/dL (ref 6.0–8.3)

## 2023-07-07 LAB — HEMOGLOBIN A1C: Hgb A1c MFr Bld: 7.8 % — ABNORMAL HIGH (ref 4.6–6.5)

## 2023-07-07 LAB — VITAMIN D 25 HYDROXY (VIT D DEFICIENCY, FRACTURES): VITD: 33.81 ng/mL (ref 30.00–100.00)

## 2023-07-07 NOTE — Progress Notes (Signed)
Subjective:    Alexandra Fitzpatrick is a 62 y.o. female and is here for a comprehensive physical exam.  HPI  Health Maintenance Due  Topic Date Due   HIV Screening  Never done   INFLUENZA VACCINE  04/10/2023   COVID-19 Vaccine (6 - 2023-24 season) 05/11/2023    Acute Concerns: None.   Chronic Issues: Prediabetes: She has been working on diet and exercise.  Intermittent fasting 11am-1pm to 7-8pm.  Most days is fasting from 12pm-7pm  Notes that intermittent fasting has helped her in the past.  Has been limiting her carb intake, prioritizing eating proteins, and cutting processed foods.  Proteins include lean meats and eggs.  Since starting the [employer] [facility] she has lost 5 lbs.  In the last month she has been going to [facility] 1-2 times a week due to her busy schedule.  Planning to go 3 times a week in November.  Last A1C was in June 2024.    Health Maintenance: Immunizations -- Will schedule for RSV and Covid at [facility]  Colonoscopy -- UTD, last done 06/15/2020. Revealed erythematous mucosa, diverticulosis, and non-bleeding internal hemorrhoids. Otherwise normal.  Mammogram -- UTD, last done 03/04/2023.  PAP -- UTD, last done 10/22/2022. Results were normal.  Bone Density -- Last done 02/29/2020.  Diet -- Intermittent fasting, eating more protein, cutting cars and processed foods.  Exercise -- Going to [facility] 1-2 times a week.   Sleep habits -- No concerns Mood -- Stable  UTD with dentist? - yes UTD with eye doctor? - yes  Weight history: Wt Readings from Last 10 Encounters:  07/07/23 214 lb (97.1 kg)  03/17/23 219 lb (99.3 kg)  10/24/22 215 lb (97.5 kg)  10/22/22 215 lb (97.5 kg)  07/04/22 208 lb 6.1 oz (94.5 kg)  10/17/21 213 lb (96.6 kg)  07/30/21 219 lb (99.3 kg)  07/03/21 216 lb 8 oz (98.2 kg)  02/01/21 210 lb (95.3 kg)  01/29/21 211 lb (95.7 kg)   Body mass index is 33.02 kg/m. Patient's last menstrual period was 10/11/2016.  Alcohol use:   reports no history of alcohol use.  Tobacco use:  Tobacco Use: Low Risk  (07/07/2023)   Patient History    Smoking Tobacco Use: Never    Smokeless Tobacco Use: Never    Passive Exposure: Never   Eligible for lung cancer screening? no     07/07/2023    8:01 AM  Depression screen PHQ 2/9  Decreased Interest 0  Down, Depressed, Hopeless 0  PHQ - 2 Score 0  Altered sleeping 0  Tired, decreased energy 0  Change in appetite 0  Feeling bad or failure about yourself  0  Trouble concentrating 0  Moving slowly or fidgety/restless 0  Suicidal thoughts 0  PHQ-9 Score 0  Difficult doing work/chores Not difficult at all     Other providers/specialists: Patient Care Team: Jarold Motto, [place] as PCP - General (Physician Assistant) Glyn Ade, PA-C as Physician Assistant (Dermatology)    PMHx, SurgHx, SocialHx, Medications, and Allergies were reviewed in the Visit Navigator and updated as appropriate.   Past Medical History:  Diagnosis Date   Abnormal cholesterol test    Allergies    Allergy 2014   Amenorrhea    Anxiety    B12 deficiency    Back pain    Breast cancer (HCC) 2017   s/p surgery and radiation   Cataract 2018   Constipation    Depression    Dysmenorrhea  Fatty liver    High blood pressure    pt denies   High blood sugar    High triglycerides    pt denies   Migraines    Pre-diabetes    Scheuermann's disease    Sleep apnea    pt stated she wa snever dx   Vitamin D deficiency      Past Surgical History:  Procedure Laterality Date   BREAST LUMPECTOMY Right 2017   GUM SURGERY     alloderm inplantation   WISDOM TOOTH EXTRACTION       Family History  Problem Relation Age of Onset   Cancer Mother        mouth   Early death Mother    Esophageal cancer Mother    Arthritis Father    Hypertension Father    Sleep apnea Father    Depression Sister    Breast cancer Sister    Arthritis Maternal Grandmother    Cancer Maternal Grandmother         septocemia   Arthritis Maternal Grandfather    COPD Maternal Grandfather    Lung cancer Maternal Grandfather        smoker   Stroke Maternal Grandfather    Arthritis Paternal Grandmother    Colon cancer Paternal Grandmother        13's   Alzheimer's disease Paternal Grandmother        ?   Cancer Paternal Grandmother    Arthritis Paternal Grandfather    Lung cancer Paternal Grandfather        abspetosis   Other Daughter        erythromelalgia   Rectal cancer Neg Hx    Stomach cancer Neg Hx     Social History   Tobacco Use   Smoking status: Never    Passive exposure: Never   Smokeless tobacco: Never   Tobacco comments:    Never smoked  Vaping Use   Vaping status: Never Used  Substance Use Topics   Alcohol use: Never   Drug use: Never    Review of Systems:   Review of Systems  Constitutional:  Negative for chills, fever, malaise/fatigue and weight loss.  HENT:  Negative for hearing loss, sinus pain and sore throat.   Respiratory:  Negative for cough and hemoptysis.   Cardiovascular:  Negative for chest pain, palpitations, leg swelling and PND.  Gastrointestinal:  Negative for abdominal pain, constipation, diarrhea, heartburn, nausea and vomiting.  Genitourinary:  Negative for dysuria, frequency and urgency.  Musculoskeletal:  Negative for back pain, myalgias and neck pain.  Skin:  Negative for itching and rash.  Neurological:  Negative for dizziness, tingling, seizures and headaches.  Endo/Heme/Allergies:  Negative for polydipsia.  Psychiatric/Behavioral:  Negative for depression. The patient is not nervous/anxious.     Objective:   BP 110/70   Pulse 66   Temp (!) 97.3 F (36.3 C)   Ht 5' 7.5" (1.715 m)   Wt 214 lb (97.1 kg)   LMP 10/11/2016   SpO2 97%   BMI 33.02 kg/m  Body mass index is 33.02 kg/m.   General Appearance:    Alert, cooperative, no distress, appears stated age  Head:    Normocephalic, without obvious abnormality, atraumatic   Eyes:    PERRL, conjunctiva/corneas clear, EOM's intact, fundi    benign, both eyes  Ears:    Normal TM's and external ear canals, both ears  Nose:   Nares normal, septum midline, mucosa normal, no drainage  or sinus tenderness  Throat:   Lips, mucosa, and tongue normal; teeth and gums normal  Neck:   Supple, symmetrical, trachea midline, no adenopathy;    thyroid:  no enlargement/tenderness/nodules; no carotid   bruit or JVD  Back:     Symmetric, no curvature, ROM normal, no CVA tenderness  Lungs:     Clear to auscultation bilaterally, respirations unlabored  Chest Wall:    No tenderness or deformity   Heart:    Regular rate and rhythm, S1 and S2 normal, no murmur, rub or gallop  Breast Exam:    Deferred   Abdomen:     Soft, non-tender, bowel sounds active all four quadrants,    no masses, no organomegaly  Genitalia:    Deferred   Extremities:   Extremities normal, atraumatic, no cyanosis or edema  Pulses:   2+ and symmetric all extremities  Skin:   Skin color, texture, turgor normal, no rashes or lesions  Lymph nodes:   Cervical, supraclavicular, and axillary nodes normal  Neurologic:   CNII-XII intact, normal strength, sensation and reflexes    throughout    Assessment/Plan:   Routine physical examination Today patient counseled on age appropriate routine health concerns for screening and prevention, each reviewed and up to date or declined. Immunizations reviewed and up to date or declined. Labs ordered and reviewed. Risk factors for depression reviewed and negative. Hearing function and visual acuity are intact. ADLs screened and addressed as needed. Functional ability and level of safety reviewed and appropriate. Education, counseling and referrals performed based on assessed risks today. Patient provided with a copy of personalized plan for preventive services.  Prediabetes Update Hemoglobin A1c and provide recommendations  Vitamin D deficiency Update vitamin D and provide  recommendations  Obesity, unspecified class, unspecified obesity type, unspecified whether serious comorbidity present Continue efforts at healthy lifestyle  Need for influenza vaccination Flu shot provided today   I, Isabelle Course, acting as a Neurosurgeon for Jarold Motto, [place]., have documented all relevant documentation on the behalf of Jarold Motto, [place], as directed by  Jarold Motto, PA while in the presence of Jarold Motto, [place].  I, Jarold Motto, [place], have reviewed all documentation for this visit. The documentation on 07/07/23 for the exam, diagnosis, procedures, and orders are all accurate and complete.  Jarold Motto, PA-C [facility]

## 2023-07-07 NOTE — Patient Instructions (Addendum)
It was great to see you! ? ?Please go to the lab for blood work.  ? ?Our office will call you with your results unless you have chosen to receive results via MyChart. ? ?If your blood work is normal we will follow-up each year for physicals and as scheduled for chronic medical problems. ? ?If anything is abnormal we will treat accordingly and get you in for a follow-up. ? ?Take care, ? ?Jerrin Recore ?  ? ? ?

## 2023-08-04 ENCOUNTER — Encounter: Payer: Self-pay | Admitting: Physician Assistant

## 2023-08-05 ENCOUNTER — Encounter: Payer: Self-pay | Admitting: Family

## 2023-08-05 ENCOUNTER — Ambulatory Visit: Payer: 59 | Admitting: Family

## 2023-08-05 VITALS — BP 145/74 | HR 60 | Temp 98.0°F | Ht 67.5 in | Wt 214.0 lb

## 2023-08-05 DIAGNOSIS — M65341 Trigger finger, right ring finger: Secondary | ICD-10-CM | POA: Diagnosis not present

## 2023-08-05 NOTE — Progress Notes (Signed)
Patient ID: Alexandra Fitzpatrick, female    DOB: [birth year], 62 y.o.   MRN: [id number]  Chief Complaint  Patient presents with   Hand Pain    Pt c/o right hand ring finger pain and stiffness. Pt state she was carrying groceries last night and when she put groceries down her finger would not release. This has happened  a month and a half ago.    Discussed the use of AI scribe software for clinical note transcription with the patient, who gave verbal consent to proceed.  History of Present Illness   The patient presents with a locked finger, a new symptom that occurred while carrying her groceries yesterday. She reports that this is the first time the finger has remained locked, despite previous instances of temporary locking that were resolved with massage. The patient attempted self-treatment with massage, hot towel wraps, cold water, and ibuprofen, but these interventions did not alleviate the symptom. The patient reports pain when trying to force the finger to straighten and when massaging the area that sends a sharp pain down the hand to her wrist, but no pain at rest. She also notes a familial history of a similar issue in her father, which occurred at her same age, & required a nerve sheath release for resolution. The patient's ability to work, particularly on a computer and using a mouse, is compromised due to the locked finger.     Assessment & Plan:     Trigger Finger - Acute onset of locked right 4th finger, unable to extend. Pain localized to the finger & palm. No palpable nodules. Family history of similar condition requiring surgical intervention. -Reach out to referral nurse for urgent orthopedic consultation. -Pt able to go to [facility] urgent clinic today -Consider potential need for steroid injection or surgical intervention based on orthopedic assessment.    Subjective:    Outpatient Medications Prior to Visit  Medication Sig Dispense Refill   carboxymethylcellulose  (REFRESH PLUS) 0.5 % SOLN 1 drop 3 (three) times daily as needed.     Cholecalciferol 25 MCG (1000 UT) tablet Take 1,000 Units by mouth daily.     clindamycin (CLEOCIN T) 1 % lotion Apply topically.     clobetasol (TEMOVATE) 0.05 % external solution      fluticasone (CUTIVATE) 0.05 % cream Apply topically.     loratadine (CLARITIN) 10 MG tablet Take 10 mg by mouth daily.     Magnesium Citrate 100 MG TABS Take by mouth.     Multiple Vitamins-Minerals (CENTRUM ADULTS PO) Take by mouth.     venlafaxine XR (EFFEXOR-XR) 75 MG 24 hr capsule TAKE 1 CAPSULE(75 MG) BY MOUTH DAILY WITH BREAKFAST 90 capsule 1   vitamin B-12 (CYANOCOBALAMIN) 1000 MCG tablet Take 1,000 mcg by mouth daily.     Vitamin D, Ergocalciferol, (DRISDOL) 1.25 MG (50000 UNIT) CAPS capsule TAKE 1 CAPSULE BY MOUTH EVERY 7 DAYS 12 capsule 0   doxycycline (VIBRA-TABS) 100 MG tablet Take 100 mg by mouth 2 (two) times daily. (Patient not taking: Reported on 08/05/2023)     No facility-administered medications prior to visit.   Past Medical History:  Diagnosis Date   Abnormal cholesterol test    Allergies    Allergy 2014   Amenorrhea    Anxiety    B12 deficiency    Back pain    Breast cancer (HCC) 2017   s/p surgery and radiation   Cataract 2018   Constipation    Depression  Dysmenorrhea    Fatty liver    High blood pressure    pt denies   High blood sugar    High triglycerides    pt denies   Migraines    Pre-diabetes    Scheuermann's disease    Sleep apnea    pt stated she wa snever dx   Vitamin D deficiency    Past Surgical History:  Procedure Laterality Date   BREAST LUMPECTOMY Right 2017   GUM SURGERY     alloderm inplantation   WISDOM TOOTH EXTRACTION     Allergies  Allergen Reactions   Codeine Other (See Comments) and Shortness Of Breath      Objective:    Physical Exam Vitals and nursing note reviewed.  Constitutional:      Appearance: Normal appearance.  Cardiovascular:     Rate and Rhythm:  Normal rate and regular rhythm.  Pulmonary:     Effort: Pulmonary effort is normal.     Breath sounds: Normal breath sounds.  Musculoskeletal:     Right hand: Swelling (very mild in 4th right finger), tenderness (right 4th finger, no bruising noted) and bony tenderness present. Decreased range of motion (right, 4th finger in contracted, flexed position). Normal sensation.  Skin:    General: Skin is warm and dry.  Neurological:     Mental Status: She is alert.  Psychiatric:        Mood and Affect: Mood normal.        Behavior: Behavior normal.    BP (!) 145/74 (BP Location: Left Arm, Patient Position: Sitting, Cuff Size: Large)   Pulse 60   Temp 98 F (36.7 C) (Temporal)   Ht 5' 7.5" (1.715 m)   Wt 214 lb (97.1 kg)   LMP 10/11/2016   SpO2 98%   BMI 33.02 kg/m  Wt Readings from Last 3 Encounters:  08/05/23 214 lb (97.1 kg)  07/07/23 214 lb (97.1 kg)  03/17/23 219 lb (99.3 kg)      Dulce Sellar, NP

## 2023-10-30 ENCOUNTER — Ambulatory Visit (INDEPENDENT_AMBULATORY_CARE_PROVIDER_SITE_OTHER): Payer: 59 | Admitting: Obstetrics and Gynecology

## 2023-10-30 ENCOUNTER — Encounter: Payer: Self-pay | Admitting: Obstetrics and Gynecology

## 2023-10-30 VITALS — BP 118/60 | HR 79 | Temp 99.5°F | Ht 68.0 in | Wt 217.0 lb

## 2023-10-30 DIAGNOSIS — Z1331 Encounter for screening for depression: Secondary | ICD-10-CM | POA: Diagnosis not present

## 2023-10-30 DIAGNOSIS — Z01419 Encounter for gynecological examination (general) (routine) without abnormal findings: Secondary | ICD-10-CM | POA: Diagnosis not present

## 2023-10-30 DIAGNOSIS — R3911 Hesitancy of micturition: Secondary | ICD-10-CM | POA: Diagnosis not present

## 2023-10-30 HISTORY — DX: Encounter for gynecological examination (general) (routine) without abnormal findings: Z01.419

## 2023-10-30 NOTE — Assessment & Plan Note (Signed)
 Cervical cancer screening performed according to ASCCP guidelines. Encouraged annual mammogram screening Colonoscopy UTD DXA UTD Labs and immunizations with her primary Encouraged safe sexual practices as indicated Encouraged healthy lifestyle practices with diet and exercise For patients under 50-61yo, I recommend 1200mg  calcium daily and 600IU of vitamin D daily.

## 2023-10-30 NOTE — Progress Notes (Signed)
63 y.o. [id number] postmenopausal female with history of right breast cancer (2017, s/p lumpectomy and radiation, completed tamoxifen in 2022) here for annual exam. Married. 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  Pt reports she has to sit and wait to urinate, requires focusing on it. Ongoing for years now. Denies dysuria, urinary frequency, UI, SUI, nocturia. Vaginal dryness and itching, improves with cerave use. Completed evaluation for postmenopausal bleeding in 2024 with SIS, benign Pap and EMB.  Postmenopausal bleeding: none Pelvic discharge or pain: none Breast mass, nipple discharge or skin changes : none Last PAP:     Component Value Date/Time   DIAGPAP  10/22/2022 1437    - Negative for intraepithelial lesion or malignancy (NILM)   DIAGPAP  10/16/2018 0000    NEGATIVE FOR INTRAEPITHELIAL LESIONS OR MALIGNANCY.   HPVHIGH Negative 10/22/2022 1437   ADEQPAP  10/22/2022 1437    Satisfactory for evaluation; transformation zone component PRESENT.   ADEQPAP  10/16/2018 0000    Satisfactory for evaluation  endocervical/transformation zone component ABSENT.   Last mammogram: 03/04/23 BI-RADS 2, scattered fibroglandular density Last colonoscopy: 06/15/20 Last DXA: 08/20/22 wnl Sexually active: yes  Exercising: no Smoker: yes  GYN HISTORY: Right breast cancer  OB History  Gravida Para Term Preterm AB Living  2 2 2  0 0 2  SAB IAB Ectopic Multiple Live Births  0 0 0 0 2    # Outcome Date GA Lbr Len/2nd Weight Sex Type Anes PTL Lv  2 Term     M    LIV  1 Term     F    LIV    Past Medical History:  Diagnosis Date   Abnormal cholesterol test    Allergies    Allergy 2014   Amenorrhea    Anxiety    B12 deficiency    Back pain    Breast cancer (HCC) 2017   s/p surgery and radiation   Cataract 2018   Constipation    Depression    Dysmenorrhea    Fatty liver    High blood pressure    pt denies   High blood sugar    High triglycerides    pt denies    Migraines    Pre-diabetes    Scheuermann's disease    Sleep apnea    pt stated she wa snever dx   Vitamin D deficiency    Well woman exam with routine gynecological exam 10/30/2023    Past Surgical History:  Procedure Laterality Date   BREAST LUMPECTOMY Right 2017   BUNIONECTOMY Right    pinky toe   GUM SURGERY     alloderm inplantation   WISDOM TOOTH EXTRACTION      Current Outpatient Medications on File Prior to Visit  Medication Sig Dispense Refill   carboxymethylcellulose (REFRESH PLUS) 0.5 % SOLN 1 drop 3 (three) times daily as needed.     clindamycin (CLEOCIN T) 1 % lotion Apply topically.     venlafaxine XR (EFFEXOR-XR) 75 MG 24 hr capsule TAKE 1 CAPSULE(75 MG) BY MOUTH DAILY WITH BREAKFAST 90 capsule 1   No current facility-administered medications on file prior to visit.    Social History   Socioeconomic History   Marital status: Married    Spouse name: Not on file   Number of children: 2   Years of education: Not on file   Highest education level: Master's degree (e.g., MA, MS, MEng, MEd, MSW, MBA)  Occupational History   Occupation: Sr  research analyst  Tobacco Use   Smoking status: Never    Passive exposure: Never   Smokeless tobacco: Never   Tobacco comments:    Never smoked  Vaping Use   Vaping status: Never Used  Substance and Sexual Activity   Alcohol use: Never   Drug use: Never   Sexual activity: Yes    Partners: Male    Birth control/protection: Post-menopausal  Other Topics Concern   Not on file  Social History Narrative   Husband is a Education officer, environmental   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   Social Drivers of Corporate investment banker Strain: Low Risk  (03/13/2023)   Overall Financial Resource Strain (CARDIA)    Difficulty of Paying Living Expenses: Not very hard  Food Insecurity: No Food Insecurity (03/13/2023)   Hunger Vital Sign    Worried About Running Out of Food in the Last Year: Never true    Ran Out of Food in  the Last Year: Never true  Transportation Needs: No Transportation Needs (03/13/2023)   PRAPARE - Administrator, Civil Service (Medical): No    Lack of Transportation (Non-Medical): No  Physical Activity: Unknown (03/13/2023)   Exercise Vital Sign    Days of Exercise per Week: 0 days    Minutes of Exercise per Session: Not on file  Stress: No Stress Concern Present (03/13/2023)   [employer] of Occupational Health - Occupational Stress Questionnaire    Feeling of Stress : Not at all  Social Connections: Moderately Isolated (03/13/2023)   Social Connection and Isolation Panel [NHANES]    Frequency of Communication with Friends and Family: Once a week    Frequency of Social Gatherings with Friends and Family: Never    Attends Religious Services: More than 4 times per year    Active Member of [employer] or Organizations: No    Attends Engineer, structural: Not on file    Marital Status: Married  Catering manager Violence: Not on file    Family History  Problem Relation Age of Onset   Cancer Mother        mouth   Early death Mother    Esophageal cancer Mother    Arthritis Father    Hypertension Father    Sleep apnea Father    Depression Sister    Breast cancer Sister    Arthritis Maternal Grandmother    Cancer Maternal Grandmother        septocemia   Arthritis Maternal Grandfather    COPD Maternal Grandfather    Lung cancer Maternal Grandfather        smoker   Stroke Maternal Grandfather    Arthritis Paternal Grandmother    Colon cancer Paternal Grandmother        59's   Alzheimer's disease Paternal Grandmother        ?   Cancer Paternal Grandmother    Arthritis Paternal Grandfather    Lung cancer Paternal Grandfather        abspetosis   Other Daughter        erythromelalgia   Rectal cancer Neg Hx    Stomach cancer Neg Hx     Allergies  Allergen Reactions   Codeine Other (See Comments) and Shortness Of Breath      PE Today's Vitals   10/30/23  1523  BP: 118/60  Pulse: 79  Temp: 99.5 F (37.5 C)  TempSrc: Oral  SpO2: 98%  Weight: 217 lb (98.4 kg)  Height: 5\' 8"  (1.727 m)   Body mass index is 32.99 kg/m.  Physical Exam Vitals reviewed. Exam conducted with a chaperone present.  Constitutional:      General: She is not in acute distress.    Appearance: Normal appearance.  HENT:     Head: Normocephalic and atraumatic.     Nose: Nose normal.  Eyes:     Extraocular Movements: Extraocular movements intact.     Conjunctiva/sclera: Conjunctivae normal.  Neck:     Thyroid: No thyroid mass, thyromegaly or thyroid tenderness.  Pulmonary:     Effort: Pulmonary effort is normal.  Chest:     Chest wall: No mass or tenderness.  Breasts:    Right: Normal. No swelling, mass, nipple discharge, skin change or tenderness.     Left: Normal. No swelling, mass, nipple discharge, skin change or tenderness.       Comments: Lumpectomy scar Abdominal:     General: There is no distension.     Palpations: Abdomen is soft.     Tenderness: There is no abdominal tenderness.  Genitourinary:    General: Normal vulva.     Exam position: Lithotomy position.     Urethra: No prolapse.     Vagina: Normal. No vaginal discharge or bleeding.     Cervix: Normal. No lesion.     Uterus: Normal. Not enlarged and not tender.      Adnexa: Right adnexa normal and left adnexa normal.  Musculoskeletal:        General: Normal range of motion.     Cervical back: Normal range of motion.  Lymphadenopathy:     Upper Body:     Right upper body: No axillary adenopathy.     Left upper body: No axillary adenopathy.     Lower Body: No right inguinal adenopathy. No left inguinal adenopathy.  Skin:    General: Skin is warm and dry.  Neurological:     General: No focal deficit present.     Mental Status: She is alert.  Psychiatric:        Mood and Affect: Mood normal.        Behavior: Behavior normal.       Assessment and Plan:        Well woman exam  with routine gynecological exam Assessment & Plan: Cervical cancer screening performed according to ASCCP guidelines. Encouraged annual mammogram screening Colonoscopy UTD DXA UTD Labs and immunizations with her primary Encouraged safe sexual practices as indicated Encouraged healthy lifestyle practices with diet and exercise For patients under 50-70yo, I recommend 1200mg  calcium daily and 600IU of vitamin D daily.    Urinary hesitancy  Chronic sx, recommend kegels and PF exercise.  Rosalyn Gess, MD

## 2023-10-30 NOTE — Patient Instructions (Signed)

## 2023-12-24 ENCOUNTER — Other Ambulatory Visit: Payer: Self-pay | Admitting: Physician Assistant

## 2024-03-16 ENCOUNTER — Encounter: Payer: Self-pay | Admitting: Physician Assistant

## 2024-06-25 ENCOUNTER — Other Ambulatory Visit: Payer: Self-pay | Admitting: Physician Assistant

## 2024-06-25 ENCOUNTER — Telehealth: Payer: Self-pay | Admitting: *Deleted

## 2024-06-25 DIAGNOSIS — Z853 Personal history of malignant neoplasm of breast: Secondary | ICD-10-CM

## 2024-06-25 NOTE — Telephone Encounter (Signed)
 Please see message and advise

## 2024-06-25 NOTE — Telephone Encounter (Signed)
 Copied from CRM [id number]. Topic: Clinical - Request for Lab/Test Order >> Jun 25, 2024 12:52 PM Alfonso ORN wrote: Reason for CRM: pt has labs that need to be done for oncology in [place]  she is wanting to confirm if [facility] can order labs for cancer antigens Code: Cancer  5819,  Cancer 27.29  [id number]. Please callback : [phone] (M)

## 2024-06-28 NOTE — Telephone Encounter (Signed)
 Left detailed message on personal voicemail Lucie said, Yes, these can be ordered with us . I have placed future orders or we can just draw them when she comes in on 10/29. I cannot interpret these labs however - they will need to be interpreted by her oncology team. Any questions please contact the office.

## 2024-06-30 ENCOUNTER — Other Ambulatory Visit: Payer: Self-pay | Admitting: Physician Assistant

## 2024-07-07 ENCOUNTER — Encounter: Payer: Self-pay | Admitting: Physician Assistant

## 2024-07-07 ENCOUNTER — Ambulatory Visit: Payer: 59 | Admitting: Physician Assistant

## 2024-07-07 VITALS — BP 122/70 | HR 72 | Temp 97.9°F | Ht 68.0 in | Wt 215.6 lb

## 2024-07-07 DIAGNOSIS — E559 Vitamin D deficiency, unspecified: Secondary | ICD-10-CM | POA: Diagnosis not present

## 2024-07-07 DIAGNOSIS — Z0000 Encounter for general adult medical examination without abnormal findings: Secondary | ICD-10-CM

## 2024-07-07 DIAGNOSIS — Z136 Encounter for screening for cardiovascular disorders: Secondary | ICD-10-CM | POA: Diagnosis not present

## 2024-07-07 DIAGNOSIS — F3342 Major depressive disorder, recurrent, in full remission: Secondary | ICD-10-CM

## 2024-07-07 DIAGNOSIS — Z7985 Long-term (current) use of injectable non-insulin antidiabetic drugs: Secondary | ICD-10-CM | POA: Diagnosis not present

## 2024-07-07 DIAGNOSIS — Z23 Encounter for immunization: Secondary | ICD-10-CM

## 2024-07-07 DIAGNOSIS — Z13220 Encounter for screening for lipoid disorders: Secondary | ICD-10-CM

## 2024-07-07 DIAGNOSIS — E119 Type 2 diabetes mellitus without complications: Secondary | ICD-10-CM | POA: Diagnosis not present

## 2024-07-07 DIAGNOSIS — Z853 Personal history of malignant neoplasm of breast: Secondary | ICD-10-CM | POA: Diagnosis not present

## 2024-07-07 DIAGNOSIS — K5904 Chronic idiopathic constipation: Secondary | ICD-10-CM

## 2024-07-07 LAB — LIPID PANEL
Cholesterol: 294 mg/dL — ABNORMAL HIGH (ref 0–200)
HDL: 53.2 mg/dL (ref >39.00)
LDL Cholesterol: 193 mg/dL — ABNORMAL HIGH (ref 0–99)
NonHDL: 241.17
Total CHOL/HDL Ratio: 6
Triglycerides: 241 mg/dL — ABNORMAL HIGH (ref 0.0–149.0)
VLDL: 48.2 mg/dL — ABNORMAL HIGH (ref 0.0–40.0)

## 2024-07-07 LAB — CBC WITH DIFFERENTIAL/PLATELET
Basophils Absolute: 0 K/uL (ref 0.0–0.1)
Basophils Relative: 0.5 % (ref 0.0–3.0)
Eosinophils Absolute: 0.2 K/uL (ref 0.0–0.7)
Eosinophils Relative: 1.8 % (ref 0.0–5.0)
HCT: 43.5 % (ref 36.0–46.0)
Hemoglobin: 14.6 g/dL (ref 12.0–15.0)
Lymphocytes Relative: 26 % (ref 12.0–46.0)
Lymphs Abs: 2.2 K/uL (ref 0.7–4.0)
MCHC: 33.6 g/dL (ref 30.0–36.0)
MCV: 91 fl (ref 78.0–100.0)
Monocytes Absolute: 0.4 K/uL (ref 0.1–1.0)
Monocytes Relative: 5.2 % (ref 3.0–12.0)
Neutro Abs: 5.7 K/uL (ref 1.4–7.7)
Neutrophils Relative %: 66.5 % (ref 43.0–77.0)
Platelets: 240 K/uL (ref 150.0–400.0)
RBC: 4.78 Mil/uL (ref 3.87–5.11)
RDW: 13.6 % (ref 11.5–15.5)
WBC: 8.6 K/uL (ref 4.0–10.5)

## 2024-07-07 LAB — COMPREHENSIVE METABOLIC PANEL WITH GFR
ALT: 19 U/L (ref 0–35)
AST: 13 U/L (ref 0–37)
Albumin: 4.5 g/dL (ref 3.5–5.2)
Alkaline Phosphatase: 72 U/L (ref 39–117)
BUN: 14 mg/dL (ref 6–23)
CO2: 28 meq/L (ref 19–32)
Calcium: 9.6 mg/dL (ref 8.4–10.5)
Chloride: 100 meq/L (ref 96–112)
Creatinine, Ser: 0.88 mg/dL (ref 0.40–1.20)
GFR: 70.81 mL/min (ref >60.00)
Glucose, Bld: 308 mg/dL — ABNORMAL HIGH (ref 70–99)
Potassium: 4.3 meq/L (ref 3.5–5.1)
Sodium: 138 meq/L (ref 135–145)
Total Bilirubin: 1.2 mg/dL (ref 0.2–1.2)
Total Protein: 7.2 g/dL (ref 6.0–8.3)

## 2024-07-07 LAB — HEMOGLOBIN A1C: Hgb A1c MFr Bld: 11.3 % — ABNORMAL HIGH (ref 4.6–6.5)

## 2024-07-07 LAB — VITAMIN D 25 HYDROXY (VIT D DEFICIENCY, FRACTURES): VITD: 25.53 ng/mL — ABNORMAL LOW (ref 30.00–100.00)

## 2024-07-07 MED ORDER — TIRZEPATIDE 2.5 MG/0.5ML ~~LOC~~ SOAJ: 2.5000 mg | SUBCUTANEOUS | 1 refills | Status: DC

## 2024-07-07 MED ORDER — VENLAFAXINE HCL ER 150 MG PO CP24: 150.0000 mg | ORAL_CAPSULE | Freq: Every day | ORAL | 1 refills | Status: AC

## 2024-07-07 NOTE — Progress Notes (Signed)
 Subjective:    Alexandra Fitzpatrick is a 63 y.o. female and is here for a comprehensive physical exam.  HPI  Health Maintenance Due  Topic Date Due   HIV Screening  Never done   Pneumococcal Vaccine: 50+ Years (1 of 1 - PCV) Never done   Influenza Vaccine  04/09/2024    Discussed the use of AI scribe software for clinical note transcription with the patient, who gave verbal consent to proceed.  History of Present Illness   Alexandra Fitzpatrick Alexandra Fitzpatrick is a 63 year old female with cancer and diabetes who presents for follow-up on elevated cancer antigens and diabetes management.  In July, one of her cancer antigens was elevated, and a repeat test is scheduled for this visit to ensure compatibility with her oncologist's system.  Her diabetes management is challenging, with A1c levels over 9%. She struggles with weight loss due to a sedentary job, despite dietary changes. She is hesitant to start certain diabetes medications due to potential side effects and insurance coverage issues. Her paternal grandmother had diabetes.  She has been taking Effexor  for several years and is considering an increased dosage due to stress from her job, cancer concerns, and family health issues. She experiences chronic constipation, managed with increased water intake, vegetables, and occasional Miralax use. She has trigger finger.  Her father has venous insufficiency and non-Parkinson's symptoms, and her daughter has multiple health issues including social anxiety and potential autism. She does not consume alcohol or smoke.        Health Maintenance: Immunizations -- due for flu shot however we are out of these at our office at the time of this visit Colonoscopy -- UpToDate; due in 2031 Mammogram -- UpToDate  PAP -- UpToDate  Bone Density -- UpToDate  Diet -- healthy Exercise -- limited  Sleep habits -- no major concerns Mood -- doing well with Effexor  but interested in increase  UTD with  dentist? - yes UTD with eye doctor? - yes   Weight history: Wt Readings from Last 10 Encounters:  07/07/24 215 lb 9.6 oz (97.8 kg)  10/30/23 217 lb (98.4 kg)  08/05/23 214 lb (97.1 kg)  07/07/23 214 lb (97.1 kg)  03/17/23 219 lb (99.3 kg)  10/24/22 215 lb (97.5 kg)  10/22/22 215 lb (97.5 kg)  07/04/22 208 lb 6.1 oz (94.5 kg)  10/17/21 213 lb (96.6 kg)  07/30/21 219 lb (99.3 kg)   Body mass index is 32.78 kg/m. Patient's last menstrual period was 10/11/2016.  Alcohol use:  reports no history of alcohol use.  Tobacco use:  Tobacco Use: Low Risk  (07/07/2024)   Patient History    Smoking Tobacco Use: Never    Smokeless Tobacco Use: Never    Passive Exposure: Never   Eligible for lung cancer screening? no     07/07/2023    8:01 AM  Depression screen PHQ 2/9  Decreased Interest 0  Down, Depressed, Hopeless 0  PHQ - 2 Score 0  Altered sleeping 0  Tired, decreased energy 0  Change in appetite 0  Feeling bad or failure about yourself  0  Trouble concentrating 0  Moving slowly or fidgety/restless 0  Suicidal thoughts 0  PHQ-9 Score 0  Difficult doing work/chores Not difficult at all     Other providers/specialists: Patient Care Team: Job Lukes, PA as PCP - General (Physician Assistant) Porter Andrez SAUNDERS, PA-C (Inactive) as Physician Assistant (Dermatology)    PMHx, SurgHx, SocialHx, Medications, and Allergies were reviewed  in the Visit Navigator and updated as appropriate.   Past Medical History:  Diagnosis Date   Abnormal cholesterol test    Allergies    Allergy 2014   Amenorrhea    Anxiety    B12 deficiency    Back pain    Breast cancer (HCC) 2017   s/p surgery and radiation   Cataract 2018   Constipation    Depression    Dysmenorrhea    Fatty liver    High blood pressure    pt denies   High blood sugar    High triglycerides    pt denies   Migraines    Pre-diabetes    Scheuermann's disease    Sleep apnea    pt stated she wa snever  dx   Vitamin D  deficiency    Well woman exam with routine gynecological exam 10/30/2023     Past Surgical History:  Procedure Laterality Date   BREAST LUMPECTOMY Right 2017   BUNIONECTOMY Right    pinky toe   GUM SURGERY     alloderm inplantation   WISDOM TOOTH EXTRACTION       Family History  Problem Relation Age of Onset   Cancer Mother        mouth   Early death Mother    Esophageal cancer Mother    Arthritis Father    Hypertension Father    Sleep apnea Father    Depression Sister    Breast cancer Sister    Arthritis Maternal Grandmother    Cancer Maternal Grandmother        septocemia   Arthritis Maternal Grandfather    COPD Maternal Grandfather    Lung cancer Maternal Grandfather        smoker   Stroke Maternal Grandfather    Arthritis Paternal Grandmother    Colon cancer Paternal Grandmother        59's   Alzheimer's disease Paternal Grandmother        ?   Cancer Paternal Grandmother    Arthritis Paternal Grandfather    Lung cancer Paternal Grandfather        abspetosis   Other Daughter        erythromelalgia   Rectal cancer Neg Hx    Stomach cancer Neg Hx     Social History   Tobacco Use   Smoking status: Never    Passive exposure: Never   Smokeless tobacco: Never   Tobacco comments:    Never smoked  Vaping Use   Vaping status: Never Used  Substance Use Topics   Alcohol use: Never   Drug use: Never    Review of Systems:   Review of Systems  Constitutional:  Negative for chills, fever, malaise/fatigue and weight loss.  HENT:  Negative for hearing loss, sinus pain and sore throat.   Respiratory:  Negative for cough and hemoptysis.   Cardiovascular:  Negative for chest pain, palpitations, leg swelling and PND.  Gastrointestinal:  Negative for abdominal pain, constipation, diarrhea, heartburn, nausea and vomiting.  Genitourinary:  Negative for dysuria, frequency and urgency.  Musculoskeletal:  Negative for back pain, myalgias and neck pain.   Skin:  Negative for itching and rash.  Neurological:  Negative for dizziness, tingling, seizures and headaches.  Endo/Heme/Allergies:  Negative for polydipsia.  Psychiatric/Behavioral:  Negative for depression. The patient is not nervous/anxious.     Objective:   Pulse 72   Temp 97.9 F (36.6 C)   Ht 5' 8 (1.727 m)   Wt  215 lb 9.6 oz (97.8 kg)   LMP 10/11/2016   SpO2 96%   BMI 32.78 kg/m  Body mass index is 32.78 kg/m.   General Appearance:    Alert, cooperative, no distress, appears stated age  Head:    Normocephalic, without obvious abnormality, atraumatic  Eyes:    PERRL, conjunctiva/corneas clear, EOM's intact, fundi    benign, both eyes  Ears:    Normal TM's and external ear canals, both ears  Nose:   Nares normal, septum midline, mucosa normal, no drainage    or sinus tenderness  Throat:   Lips, mucosa, and tongue normal; teeth and gums normal  Neck:   Supple, symmetrical, trachea midline, no adenopathy;    thyroid:  no enlargement/tenderness/nodules; no carotid   bruit or JVD  Back:     Symmetric, no curvature, ROM normal, no CVA tenderness  Lungs:     Clear to auscultation bilaterally, respirations unlabored  Chest Wall:    No tenderness or deformity   Heart:    Regular rate and rhythm, S1 and S2 normal, no murmur, rub or gallop  Breast Exam:    Deferred  Abdomen:     Soft, non-tender, bowel sounds active all four quadrants,    no masses, no organomegaly  Genitalia:    Deferred   Extremities:   Extremities normal, atraumatic, no cyanosis or edema  Pulses:   2+ and symmetric all extremities  Skin:   Skin color, texture, turgor normal, no rashes or lesions  Lymph nodes:   Cervical, supraclavicular, and axillary nodes normal  Neurologic:   CNII-XII intact, normal strength, sensation and reflexes    throughout    Assessment/Plan:   Assessment and Plan    Comprehensive Physical Exam (CPE) preventive care annual visit Today patient counseled on age appropriate  routine health concerns for screening and prevention, each reviewed and up to date or declined. Immunizations reviewed and up to date or declined. Labs ordered and reviewed. Risk factors for depression reviewed and negative. Hearing function and visual acuity are intact. ADLs screened and addressed as needed. Functional ability and level of safety reviewed and appropriate. Education, counseling and referrals performed based on assessed risks today. Patient provided with a copy of personalized plan for preventive services.  Type 2 diabetes mellitus without complication, without long-term current use of insulin  (HCC); Long-term current use of injectable noninsulin antidiabetic medication  Type 2 diabetes with A1c previously over 9%. Concerns about insurance coverage for GLP-1 medications. - Submit prescription for Mounjaro (tirzepatide) under diabetes diagnosis. - If insurance requires, consider starting metformin extended release at the lowest dose. - Provide a sample of continuous glucose monitor for trial use. - Send information on glucose monitoring to her MyChart.  Constipation, chronic idiopathic Chronic constipation with potential exacerbation from Mounjaro. - Start daily half dose of Miralax. - Monitor for bowel obstruction symptoms and discontinue GLP-1 if necessary.  Major Depressive Disorder. Recurrent, In Full Remission Major depressive disorder with increased stress. Currently on Effexor . - Increase Effexor  to 150 mg extended release daily.  - reports there is no suicidal ideation/hi       Lucie Buttner, PA-C [place]

## 2024-07-08 ENCOUNTER — Other Ambulatory Visit (HOSPITAL_COMMUNITY): Payer: Self-pay

## 2024-07-08 ENCOUNTER — Ambulatory Visit: Payer: Self-pay | Admitting: Physician Assistant

## 2024-07-08 ENCOUNTER — Encounter: Payer: Self-pay | Admitting: Physician Assistant

## 2024-07-08 ENCOUNTER — Telehealth: Payer: Self-pay

## 2024-07-08 ENCOUNTER — Telehealth: Payer: Self-pay | Admitting: *Deleted

## 2024-07-08 ENCOUNTER — Other Ambulatory Visit: Payer: Self-pay | Admitting: Physician Assistant

## 2024-07-08 DIAGNOSIS — E1169 Type 2 diabetes mellitus with other specified complication: Secondary | ICD-10-CM

## 2024-07-08 MED ORDER — ATORVASTATIN CALCIUM 20 MG PO TABS: 20.0000 mg | ORAL_TABLET | Freq: Every day | ORAL | 3 refills | Status: AC

## 2024-07-08 NOTE — Telephone Encounter (Signed)
 Pharmacy Patient Advocate Encounter   Received notification from Pt Calls Messages that prior authorization for Mounjaro 2.5MG /0.5ML auto-injectors is required/requested.   Insurance verification completed.   The patient is insured through [facility].   Per test claim: PA required; PA submitted to above mentioned insurance via Latent Key/confirmation #/EOC [facility] Status is pending

## 2024-07-08 NOTE — Telephone Encounter (Signed)
 Please do PA for Mounjaro 2.5 mg.

## 2024-07-08 NOTE — Telephone Encounter (Signed)
 Patient is Type 2 Diabetic and A1c was 11.3 07/07/2024.

## 2024-07-09 LAB — CEA: CEA: 2.1 ng/mL

## 2024-07-09 LAB — CANCER ANTIGEN 15-3: Cancer Antigen-Breast 15-3: 33 U/mL — ABNORMAL HIGH (ref <32)

## 2024-07-09 LAB — CANCER ANTIGEN 27.29: CA 27.29: 59 U/mL — ABNORMAL HIGH (ref <38)

## 2024-07-09 NOTE — Telephone Encounter (Signed)
 Pharmacy Patient Advocate Encounter  Received notification from [facility] that Prior Authorization for  Mounjaro 2.5MG /0.5ML auto-injectors  has been APPROVED from 07/08/24 to 07/08/27   PA #/Case ID/Reference #: [id number]

## 2024-07-28 ENCOUNTER — Ambulatory Visit (HOSPITAL_BASED_OUTPATIENT_CLINIC_OR_DEPARTMENT_OTHER)
Admission: RE | Admit: 2024-07-28 | Discharge: 2024-07-28 | Disposition: A | Discharge status: Home / Self Care | Payer: Self-pay | Source: Ambulatory Visit | Attending: Physician Assistant | Admitting: Physician Assistant

## 2024-07-28 DIAGNOSIS — E785 Hyperlipidemia, unspecified: Secondary | ICD-10-CM | POA: Insufficient documentation

## 2024-07-28 DIAGNOSIS — E1169 Type 2 diabetes mellitus with other specified complication: Secondary | ICD-10-CM | POA: Insufficient documentation

## 2024-07-29 ENCOUNTER — Ambulatory Visit: Payer: Self-pay | Admitting: Physician Assistant

## 2024-08-02 ENCOUNTER — Encounter: Payer: Self-pay | Admitting: Physician Assistant

## 2024-08-03 MED ORDER — FREESTYLE LIBRE 3 PLUS SENSOR MISC: 2 refills | Status: AC

## 2024-08-31 ENCOUNTER — Other Ambulatory Visit: Payer: Self-pay | Admitting: Physician Assistant

## 2024-11-01 ENCOUNTER — Ambulatory Visit: Payer: 59 | Admitting: Obstetrics and Gynecology

## 2025-07-11 ENCOUNTER — Encounter: Admitting: Physician Assistant
